# Patient Record
Sex: Male | Born: 1990
Health system: Southern US, Community
[De-identification: ages and names within clinical notes are randomized; demographics above are authoritative.]

## PROBLEM LIST (undated history)

## (undated) DIAGNOSIS — I219 Acute myocardial infarction, unspecified: Secondary | ICD-10-CM

## (undated) DIAGNOSIS — Q210 Ventricular septal defect: Secondary | ICD-10-CM

## (undated) DIAGNOSIS — M549 Dorsalgia, unspecified: Secondary | ICD-10-CM

## (undated) DIAGNOSIS — F419 Anxiety disorder, unspecified: Secondary | ICD-10-CM

## (undated) DIAGNOSIS — K219 Gastro-esophageal reflux disease without esophagitis: Secondary | ICD-10-CM

## (undated) DIAGNOSIS — K279 Peptic ulcer, site unspecified, unspecified as acute or chronic, without hemorrhage or perforation: Secondary | ICD-10-CM

## (undated) DIAGNOSIS — I442 Atrioventricular block, complete: Secondary | ICD-10-CM

## (undated) DIAGNOSIS — Z950 Presence of cardiac pacemaker: Secondary | ICD-10-CM

## (undated) DIAGNOSIS — I272 Pulmonary hypertension, unspecified: Secondary | ICD-10-CM

## (undated) DIAGNOSIS — M25569 Pain in unspecified knee: Secondary | ICD-10-CM

## (undated) HISTORY — PX: EYE SURGERY: SHX253

## (undated) HISTORY — DX: Gastro-esophageal reflux disease without esophagitis: K21.9

## (undated) HISTORY — PX: TONSILLECTOMY: SUR1361

## (undated) HISTORY — DX: Peptic ulcer, site unspecified, unspecified as acute or chronic, without hemorrhage or perforation: K27.9

## (undated) SURGERY — EGD (ESOPHAGOGASTRODUODENOSCOPY)
Anesthesia: Moderate Sedation

---
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
## 2011-01-04 DIAGNOSIS — I219 Acute myocardial infarction, unspecified: Secondary | ICD-10-CM

## 2011-01-04 HISTORY — DX: Acute myocardial infarction, unspecified: I21.9

## 2011-01-06 ENCOUNTER — Ambulatory Visit (INDEPENDENT_AMBULATORY_CARE_PROVIDER_SITE_OTHER): Payer: Medicaid Other | Admitting: Internal Medicine

## 2011-02-07 ENCOUNTER — Inpatient Hospital Stay (HOSPITAL_COMMUNITY)
Admission: EM | Admit: 2011-02-07 | Discharge: 2011-02-24 | DRG: 957 | Disposition: A | Discharge status: Other Acute Inpatient Hospital | Payer: Medicaid Other | Attending: Surgery | Admitting: Surgery

## 2011-02-07 ENCOUNTER — Emergency Department (HOSPITAL_COMMUNITY): Payer: Medicaid Other

## 2011-02-07 ENCOUNTER — Encounter (HOSPITAL_COMMUNITY): Payer: Self-pay | Admitting: Emergency Medicine

## 2011-02-07 ENCOUNTER — Other Ambulatory Visit: Payer: Self-pay

## 2011-02-07 DIAGNOSIS — S3210XA Unspecified fracture of sacrum, initial encounter for closed fracture: Secondary | ICD-10-CM | POA: Diagnosis present

## 2011-02-07 DIAGNOSIS — S32509A Unspecified fracture of unspecified pubis, initial encounter for closed fracture: Secondary | ICD-10-CM

## 2011-02-07 DIAGNOSIS — I498 Other specified cardiac arrhythmias: Secondary | ICD-10-CM | POA: Diagnosis present

## 2011-02-07 DIAGNOSIS — S069X9A Unspecified intracranial injury with loss of consciousness of unspecified duration, initial encounter: Secondary | ICD-10-CM | POA: Diagnosis present

## 2011-02-07 DIAGNOSIS — Y92410 Unspecified street and highway as the place of occurrence of the external cause: Secondary | ICD-10-CM

## 2011-02-07 DIAGNOSIS — F341 Dysthymic disorder: Secondary | ICD-10-CM | POA: Diagnosis present

## 2011-02-07 DIAGNOSIS — T17908A Unspecified foreign body in respiratory tract, part unspecified causing other injury, initial encounter: Secondary | ICD-10-CM | POA: Diagnosis present

## 2011-02-07 DIAGNOSIS — I442 Atrioventricular block, complete: Secondary | ICD-10-CM | POA: Diagnosis present

## 2011-02-07 DIAGNOSIS — S060X9A Concussion with loss of consciousness of unspecified duration, initial encounter: Secondary | ICD-10-CM

## 2011-02-07 DIAGNOSIS — S064X0A Epidural hemorrhage without loss of consciousness, initial encounter: Secondary | ICD-10-CM | POA: Diagnosis present

## 2011-02-07 DIAGNOSIS — S064XAA Epidural hemorrhage with loss of consciousness status unknown, initial encounter: Secondary | ICD-10-CM | POA: Diagnosis present

## 2011-02-07 DIAGNOSIS — K5900 Constipation, unspecified: Secondary | ICD-10-CM | POA: Diagnosis not present

## 2011-02-07 DIAGNOSIS — S129XXA Fracture of neck, unspecified, initial encounter: Secondary | ICD-10-CM | POA: Diagnosis present

## 2011-02-07 DIAGNOSIS — S32599A Other specified fracture of unspecified pubis, initial encounter for closed fracture: Secondary | ICD-10-CM | POA: Diagnosis present

## 2011-02-07 DIAGNOSIS — Z79899 Other long term (current) drug therapy: Secondary | ICD-10-CM

## 2011-02-07 DIAGNOSIS — I2789 Other specified pulmonary heart diseases: Secondary | ICD-10-CM | POA: Diagnosis present

## 2011-02-07 DIAGNOSIS — J45909 Unspecified asthma, uncomplicated: Secondary | ICD-10-CM | POA: Diagnosis present

## 2011-02-07 DIAGNOSIS — S069XAA Unspecified intracranial injury with loss of consciousness status unknown, initial encounter: Secondary | ICD-10-CM | POA: Diagnosis present

## 2011-02-07 DIAGNOSIS — J690 Pneumonitis due to inhalation of food and vomit: Secondary | ICD-10-CM | POA: Diagnosis present

## 2011-02-07 DIAGNOSIS — S0083XA Contusion of other part of head, initial encounter: Secondary | ICD-10-CM

## 2011-02-07 DIAGNOSIS — I272 Pulmonary hypertension, unspecified: Secondary | ICD-10-CM | POA: Diagnosis present

## 2011-02-07 DIAGNOSIS — S1093XA Contusion of unspecified part of neck, initial encounter: Secondary | ICD-10-CM

## 2011-02-07 DIAGNOSIS — S12100A Unspecified displaced fracture of second cervical vertebra, initial encounter for closed fracture: Principal | ICD-10-CM | POA: Diagnosis present

## 2011-02-07 DIAGNOSIS — K219 Gastro-esophageal reflux disease without esophagitis: Secondary | ICD-10-CM | POA: Diagnosis not present

## 2011-02-07 DIAGNOSIS — S0003XA Contusion of scalp, initial encounter: Secondary | ICD-10-CM

## 2011-02-07 DIAGNOSIS — D62 Acute posthemorrhagic anemia: Secondary | ICD-10-CM | POA: Diagnosis not present

## 2011-02-07 DIAGNOSIS — F17200 Nicotine dependence, unspecified, uncomplicated: Secondary | ICD-10-CM | POA: Diagnosis present

## 2011-02-07 DIAGNOSIS — Z8241 Family history of sudden cardiac death: Secondary | ICD-10-CM

## 2011-02-07 DIAGNOSIS — F1210 Cannabis abuse, uncomplicated: Secondary | ICD-10-CM | POA: Diagnosis present

## 2011-02-07 DIAGNOSIS — Q210 Ventricular septal defect: Secondary | ICD-10-CM

## 2011-02-07 DIAGNOSIS — F1010 Alcohol abuse, uncomplicated: Secondary | ICD-10-CM | POA: Diagnosis present

## 2011-02-07 DIAGNOSIS — S27329A Contusion of lung, unspecified, initial encounter: Secondary | ICD-10-CM | POA: Diagnosis present

## 2011-02-07 HISTORY — DX: Anxiety disorder, unspecified: F41.9

## 2011-02-07 HISTORY — DX: Atrioventricular block, complete: I44.2

## 2011-02-07 HISTORY — DX: Pain in unspecified knee: M25.569

## 2011-02-07 HISTORY — DX: Pulmonary hypertension, unspecified: I27.20

## 2011-02-07 HISTORY — DX: Ventricular septal defect: Q21.0

## 2011-02-07 HISTORY — DX: Dorsalgia, unspecified: M54.9

## 2011-02-07 LAB — POCT I-STAT 3, ART BLOOD GAS (G3+)
O2 Saturation: 96 %
TCO2: 19 mmol/L (ref 0–100)
pCO2 arterial: 35.6 mmHg (ref 35.0–45.0)
pH, Arterial: 7.308 — ABNORMAL LOW (ref 7.350–7.450)
pO2, Arterial: 86 mmHg (ref 80.0–100.0)

## 2011-02-07 LAB — MRSA PCR SCREENING: MRSA by PCR: NEGATIVE

## 2011-02-07 LAB — URINE MICROSCOPIC-ADD ON

## 2011-02-07 LAB — DIFFERENTIAL
Basophils Relative: 0 % (ref 0–1)
Eosinophils Relative: 1 % (ref 0–5)
Lymphocytes Relative: 22 % (ref 12–46)
Monocytes Absolute: 2.2 10*3/uL — ABNORMAL HIGH (ref 0.1–1.0)
Monocytes Relative: 4 % (ref 3–12)
Neutrophils Relative %: 73 % (ref 43–77)

## 2011-02-07 LAB — URINALYSIS, ROUTINE W REFLEX MICROSCOPIC
Glucose, UA: 500 mg/dL — AB
Ketones, ur: 15 mg/dL — AB
Leukocytes, UA: NEGATIVE
Nitrite: NEGATIVE
Specific Gravity, Urine: >1.03 — ABNORMAL HIGH (ref 1.005–1.030)
pH: 6.5 (ref 5.0–8.0)

## 2011-02-07 LAB — CBC
HCT: 41.8 % (ref 39.0–52.0)
Hemoglobin: 15.3 g/dL (ref 13.0–17.0)
MCH: 30.4 pg (ref 26.0–34.0)
MCH: 30.4 pg (ref 26.0–34.0)
MCHC: 33.3 g/dL (ref 30.0–36.0)
MCV: 91.5 fL (ref 78.0–100.0)
Platelets: 309 10*3/uL (ref 150–400)
RBC: 5.03 MIL/uL (ref 4.22–5.81)
RDW: 13.9 % (ref 11.5–15.5)
WBC: 41.8 10*3/uL — ABNORMAL HIGH (ref 4.0–10.5)

## 2011-02-07 LAB — POCT I-STAT, CHEM 8
Chloride: 110 mEq/L (ref 96–112)
Creatinine, Ser: 0.8 mg/dL (ref 0.50–1.35)
Glucose, Bld: 204 mg/dL — ABNORMAL HIGH (ref 70–99)
HCT: 48 % (ref 39.0–52.0)
Hemoglobin: 16.3 g/dL (ref 13.0–17.0)
Potassium: 3.6 mEq/L (ref 3.5–5.1)
Sodium: 144 mEq/L (ref 135–145)

## 2011-02-07 LAB — RAPID URINE DRUG SCREEN, HOSP PERFORMED
Barbiturates: NOT DETECTED
Benzodiazepines: NOT DETECTED
Cocaine: NOT DETECTED

## 2011-02-07 LAB — ETHANOL: Alcohol, Ethyl (B): <11 mg/dL (ref 0–11)

## 2011-02-07 MED ORDER — LORAZEPAM 2 MG/ML IJ SOLN
1.0000 mg | Freq: Once | INTRAMUSCULAR | Status: AC
  Administered 2011-02-07: 1 mg via INTRAVENOUS

## 2011-02-07 MED ORDER — MORPHINE SULFATE (PF) 1 MG/ML IV SOLN
INTRAVENOUS | Status: DC
  Administered 2011-02-07: 1.5 mg via INTRAVENOUS
  Administered 2011-02-07 – 2011-02-08 (×2): via INTRAVENOUS
  Administered 2011-02-08: 4.5 mg via INTRAVENOUS
  Administered 2011-02-08: 18 mg via INTRAVENOUS
  Administered 2011-02-08: 4.3 mg via INTRAVENOUS
  Administered 2011-02-08: 7.5 mg via INTRAVENOUS
  Administered 2011-02-08: 4.5 mg via INTRAVENOUS
  Administered 2011-02-09: 21:00:00 via INTRAVENOUS
  Administered 2011-02-09: 6 mg via INTRAVENOUS
  Administered 2011-02-09: 5.86 mg via INTRAVENOUS
  Administered 2011-02-09: 6.29 mg via INTRAVENOUS
  Administered 2011-02-09: 9.59 mg via INTRAVENOUS
  Administered 2011-02-09: 12:00:00 via INTRAVENOUS
  Administered 2011-02-09: 4.5 mg via INTRAVENOUS
  Administered 2011-02-09: 6 mg via INTRAVENOUS
  Administered 2011-02-10: 16.02 mg via INTRAVENOUS
  Administered 2011-02-10: 7.5 mg via INTRAVENOUS
  Administered 2011-02-10: 10.5 mg via INTRAVENOUS
  Administered 2011-02-10: 4.02 mg via INTRAVENOUS
  Administered 2011-02-10: 11.89 mg via INTRAVENOUS
  Administered 2011-02-10: 6 mg via INTRAVENOUS
  Administered 2011-02-10: 11:00:00 via INTRAVENOUS
  Administered 2011-02-11: 12 mg via INTRAVENOUS
  Administered 2011-02-11: 6 mg via INTRAVENOUS
  Administered 2011-02-11: 03:00:00 via INTRAVENOUS
  Administered 2011-02-11: 10.5 mg via INTRAVENOUS
  Filled 2011-02-07 (×8): qty 25

## 2011-02-07 MED ORDER — POTASSIUM CHLORIDE IN NACL 20-0.45 MEQ/L-% IV SOLN
INTRAVENOUS | Status: DC
  Administered 2011-02-07: 21:00:00 via INTRAVENOUS
  Administered 2011-02-08: 125 mL via INTRAVENOUS
  Administered 2011-02-08: 06:00:00 via INTRAVENOUS
  Administered 2011-02-08: 125 mL via INTRAVENOUS
  Administered 2011-02-09: 75 mL via INTRAVENOUS
  Administered 2011-02-09: 125 mL via INTRAVENOUS
  Administered 2011-02-10: 05:00:00 via INTRAVENOUS
  Administered 2011-02-10: 1000 mL via INTRAVENOUS
  Filled 2011-02-07 (×10): qty 1000

## 2011-02-07 MED ORDER — DOCUSATE SODIUM 100 MG PO CAPS
100.0000 mg | ORAL_CAPSULE | Freq: Two times a day (BID) | ORAL | Status: DC
  Administered 2011-02-09 – 2011-02-15 (×12): 100 mg via ORAL
  Filled 2011-02-07 (×11): qty 1

## 2011-02-07 MED ORDER — PANTOPRAZOLE SODIUM 40 MG IV SOLR
40.0000 mg | Freq: Every day | INTRAVENOUS | Status: DC
  Administered 2011-02-07 – 2011-02-08 (×2): 40 mg via INTRAVENOUS
  Filled 2011-02-07 (×5): qty 40

## 2011-02-07 MED ORDER — ALBUTEROL SULFATE (5 MG/ML) 0.5% IN NEBU
2.5000 mg | INHALATION_SOLUTION | RESPIRATORY_TRACT | Status: DC
  Administered 2011-02-07 – 2011-02-10 (×15): 2.5 mg via RESPIRATORY_TRACT
  Filled 2011-02-07 (×16): qty 0.5

## 2011-02-07 MED ORDER — SODIUM CHLORIDE 0.9 % IV SOLN
INTRAVENOUS | Status: DC
  Administered 2011-02-07: 12:00:00 via INTRAVENOUS

## 2011-02-07 MED ORDER — LORAZEPAM 2 MG/ML IJ SOLN: 1.0000 mg | Freq: Once | INTRAMUSCULAR | Status: DC

## 2011-02-07 MED ORDER — ONDANSETRON HCL 4 MG/2ML IJ SOLN
INTRAMUSCULAR | Status: AC
  Administered 2011-02-07: 4 mg via INTRAVENOUS
  Filled 2011-02-07: qty 2

## 2011-02-07 MED ORDER — PIPERACILLIN-TAZOBACTAM 3.375 G IVPB
3.3750 g | Freq: Three times a day (TID) | INTRAVENOUS | Status: AC
  Administered 2011-02-07 – 2011-02-14 (×21): 3.375 g via INTRAVENOUS
  Filled 2011-02-07 (×24): qty 50

## 2011-02-07 MED ORDER — DIPHENHYDRAMINE HCL 50 MG/ML IJ SOLN: 12.5000 mg | Freq: Four times a day (QID) | INTRAMUSCULAR | Status: DC | PRN

## 2011-02-07 MED ORDER — ONDANSETRON HCL 4 MG/2ML IJ SOLN
4.0000 mg | Freq: Once | INTRAMUSCULAR | Status: AC
  Administered 2011-02-07: 4 mg via INTRAVENOUS

## 2011-02-07 MED ORDER — MORPHINE SULFATE 2 MG/ML IJ SOLN: 2.0000 mg | INTRAMUSCULAR | Status: DC | PRN

## 2011-02-07 MED ORDER — DIPHENHYDRAMINE HCL 12.5 MG/5ML PO ELIX
12.5000 mg | ORAL_SOLUTION | Freq: Four times a day (QID) | ORAL | Status: DC | PRN
  Filled 2011-02-07: qty 5

## 2011-02-07 MED ORDER — FENTANYL CITRATE 0.05 MG/ML IJ SOLN
50.0000 ug | Freq: Once | INTRAMUSCULAR | Status: AC
  Administered 2011-02-07: 50 ug via INTRAVENOUS
  Filled 2011-02-07: qty 2

## 2011-02-07 MED ORDER — MORPHINE SULFATE 2 MG/ML IJ SOLN
2.0000 mg | INTRAMUSCULAR | Status: DC | PRN
  Administered 2011-02-07 (×2): 2 mg via INTRAVENOUS
  Filled 2011-02-07 (×2): qty 1

## 2011-02-07 MED ORDER — ONDANSETRON HCL 4 MG PO TABS
4.0000 mg | ORAL_TABLET | Freq: Four times a day (QID) | ORAL | Status: DC | PRN
  Administered 2011-02-09 – 2011-02-13 (×5): 4 mg via ORAL
  Filled 2011-02-07 (×5): qty 1

## 2011-02-07 MED ORDER — ENOXAPARIN SODIUM 30 MG/0.3ML ~~LOC~~ SOLN
30.0000 mg | Freq: Two times a day (BID) | SUBCUTANEOUS | Status: DC
  Administered 2011-02-08 – 2011-02-13 (×12): 30 mg via SUBCUTANEOUS
  Filled 2011-02-07 (×14): qty 0.3

## 2011-02-07 MED ORDER — BIOTENE DRY MOUTH MT LIQD
15.0000 mL | Freq: Four times a day (QID) | OROMUCOSAL | Status: DC
  Administered 2011-02-08 – 2011-02-20 (×36): 15 mL via OROMUCOSAL

## 2011-02-07 MED ORDER — PANTOPRAZOLE SODIUM 40 MG PO TBEC
40.0000 mg | DELAYED_RELEASE_TABLET | Freq: Every day | ORAL | Status: DC
  Administered 2011-02-09 – 2011-02-10 (×2): 40 mg via ORAL
  Filled 2011-02-07 (×2): qty 1

## 2011-02-07 MED ORDER — CHLORHEXIDINE GLUCONATE 0.12 % MT SOLN
15.0000 mL | Freq: Two times a day (BID) | OROMUCOSAL | Status: DC
  Administered 2011-02-07 – 2011-02-24 (×30): 15 mL via OROMUCOSAL
  Filled 2011-02-07 (×38): qty 15

## 2011-02-07 MED ORDER — ONDANSETRON HCL 4 MG/2ML IJ SOLN
4.0000 mg | Freq: Four times a day (QID) | INTRAMUSCULAR | Status: DC | PRN
  Administered 2011-02-10 – 2011-02-16 (×5): 4 mg via INTRAVENOUS
  Filled 2011-02-07 (×7): qty 2

## 2011-02-07 MED ORDER — LORAZEPAM 2 MG/ML IJ SOLN
INTRAMUSCULAR | Status: AC
  Administered 2011-02-07: 1 mg via INTRAVENOUS
  Filled 2011-02-07: qty 1

## 2011-02-07 MED ORDER — LORAZEPAM 2 MG/ML IJ SOLN
INTRAMUSCULAR | Status: AC
  Administered 2011-02-07: 1 mg
  Filled 2011-02-07: qty 1

## 2011-02-07 MED ORDER — NALOXONE HCL 0.4 MG/ML IJ SOLN: 0.4000 mg | INTRAMUSCULAR | Status: DC | PRN

## 2011-02-07 MED ORDER — SODIUM CHLORIDE 0.9 % IJ SOLN: 9.0000 mL | INTRAMUSCULAR | Status: DC | PRN

## 2011-02-07 NOTE — ED Notes (Signed)
Unable to assess pt living and suicide as well as hx and allergies due to aloc
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
## 2011-02-07 NOTE — ED Notes (Signed)
Completed by Dr Horace Porteous RN and [facility] RN Patient tolerated procedure without incident moves all extremities bilateral equal and strong full sensation.

## 2011-02-07 NOTE — ED Notes (Signed)
Dr Ignacia Palma asked if pt needs to be on [facility] for transport and Dr Ignacia Palma reported that the pt did not need to go on [facility] for transport.

## 2011-02-07 NOTE — ED Notes (Signed)
Patient states pain improved scale 7/10 achy with Father and Grandmother at bedside.
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
## 2011-02-07 NOTE — ED Notes (Signed)
Patient states pain 7/10 achy sharp lower back bilateral lower extremities and chest.  Airway intact bilateral equal chest rise and fall. Ax3 father at bedside.

## 2011-02-07 NOTE — ED Notes (Signed)
RN to ct transfer to ct table holding c-spine. Patient tolerated transfer without incident.  Move all extremities bilateral equal and strong. Full sensation.

## 2011-02-07 NOTE — ED Notes (Signed)
Respiratory at bedside for ABG
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
## 2011-02-07 NOTE — ED Notes (Signed)
Pt placed on 15L NRB due to low sats.
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
## 2011-02-07 NOTE — ED Notes (Signed)
Father and grandmother at bedside
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
## 2011-02-07 NOTE — Progress Notes (Signed)
Trauma On Call Note  Came to see the patient in the ICU where he has been for about one hour.  His oxygen saturation is 97%100% on 6L,   He is still very confused.  Somewhat combative.  No abdominal pain.  No peritonitis.  Urine output is okay.  No need for abdominal exploration currently.  Marta Lamas. Gae Bon, MD, FACS [phone] Trauma Surgeon
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
## 2011-02-07 NOTE — ED Notes (Signed)
Patient returned from CT with RN transferred from CT to ED bed without incident. Moves all extremities bilateral equal and full sensation.

## 2011-02-08 ENCOUNTER — Encounter (HOSPITAL_COMMUNITY): Payer: Self-pay | Admitting: Internal Medicine

## 2011-02-08 ENCOUNTER — Inpatient Hospital Stay (HOSPITAL_COMMUNITY): Payer: Medicaid Other

## 2011-02-08 DIAGNOSIS — Z720 Tobacco use: Secondary | ICD-10-CM | POA: Insufficient documentation

## 2011-02-08 DIAGNOSIS — D62 Acute posthemorrhagic anemia: Secondary | ICD-10-CM | POA: Diagnosis not present

## 2011-02-08 DIAGNOSIS — F1010 Alcohol abuse, uncomplicated: Secondary | ICD-10-CM | POA: Insufficient documentation

## 2011-02-08 DIAGNOSIS — I442 Atrioventricular block, complete: Secondary | ICD-10-CM

## 2011-02-08 DIAGNOSIS — F1210 Cannabis abuse, uncomplicated: Secondary | ICD-10-CM | POA: Insufficient documentation

## 2011-02-08 LAB — CBC
HCT: 37.2 % — ABNORMAL LOW (ref 39.0–52.0)
Hemoglobin: 12.6 g/dL — ABNORMAL LOW (ref 13.0–17.0)
MCHC: 33.9 g/dL (ref 30.0–36.0)
RBC: 4.15 MIL/uL — ABNORMAL LOW (ref 4.22–5.81)

## 2011-02-08 LAB — GLUCOSE, CAPILLARY: Glucose-Capillary: 147 mg/dL — ABNORMAL HIGH (ref 70–99)

## 2011-02-08 NOTE — Progress Notes (Signed)
Dr. Lindie Spruce called with results of ECG (pt is in 3rd degree heart block), additional assessment information given, Dr. Lindie Spruce saw ECG, no orders received
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
## 2011-02-08 NOTE — Progress Notes (Signed)
  Echocardiogram 2D Echocardiogram has been performed.  Peyton Spengler, Real Cons 02/08/2011, 4:30 PM
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
## 2011-02-08 NOTE — Progress Notes (Signed)
UR of chart completed.  

## 2011-02-09 ENCOUNTER — Inpatient Hospital Stay (HOSPITAL_COMMUNITY): Payer: Medicaid Other

## 2011-02-09 ENCOUNTER — Encounter (HOSPITAL_COMMUNITY): Payer: Self-pay | Admitting: Internal Medicine

## 2011-02-09 DIAGNOSIS — Q210 Ventricular septal defect: Secondary | ICD-10-CM

## 2011-02-09 DIAGNOSIS — I272 Pulmonary hypertension, unspecified: Secondary | ICD-10-CM | POA: Diagnosis present

## 2011-02-09 DIAGNOSIS — I2789 Other specified pulmonary heart diseases: Secondary | ICD-10-CM

## 2011-02-09 LAB — COMPREHENSIVE METABOLIC PANEL WITH GFR
ALT: 166 U/L — ABNORMAL HIGH (ref 0–53)
AST: 95 U/L — ABNORMAL HIGH (ref 0–37)
Albumin: 3 g/dL — ABNORMAL LOW (ref 3.5–5.2)
Alkaline Phosphatase: 61 U/L (ref 39–117)
BUN: 8 mg/dL (ref 6–23)
CO2: 23 meq/L (ref 19–32)
Calcium: 8.3 mg/dL — ABNORMAL LOW (ref 8.4–10.5)
Chloride: 104 meq/L (ref 96–112)
Creatinine, Ser: 0.55 mg/dL (ref 0.50–1.35)
GFR calc Af Amer: >90 mL/min
GFR calc non Af Amer: >90 mL/min
Glucose, Bld: 129 mg/dL — ABNORMAL HIGH (ref 70–99)
Potassium: 4.4 meq/L (ref 3.5–5.1)
Sodium: 135 meq/L (ref 135–145)
Total Bilirubin: 0.8 mg/dL (ref 0.3–1.2)
Total Protein: 5.6 g/dL — ABNORMAL LOW (ref 6.0–8.3)

## 2011-02-09 LAB — CBC
HCT: 34.3 % — ABNORMAL LOW (ref 39.0–52.0)
Hemoglobin: 11.3 g/dL — ABNORMAL LOW (ref 13.0–17.0)
MCH: 30 pg (ref 26.0–34.0)
MCHC: 32.9 g/dL (ref 30.0–36.0)
MCV: 91 fL (ref 78.0–100.0)
Platelets: 125 K/uL — ABNORMAL LOW (ref 150–400)
RBC: 3.77 MIL/uL — ABNORMAL LOW (ref 4.22–5.81)
RDW: 14 % (ref 11.5–15.5)
WBC: 22.3 K/uL — ABNORMAL HIGH (ref 4.0–10.5)

## 2011-02-09 LAB — T4, FREE: Free T4: 0.98 ng/dL (ref 0.80–1.80)

## 2011-02-09 MED ORDER — ACETAMINOPHEN 325 MG PO TABS
650.0000 mg | ORAL_TABLET | Freq: Four times a day (QID) | ORAL | Status: DC | PRN
  Administered 2011-02-10: 650 mg via ORAL
  Filled 2011-02-09: qty 2

## 2011-02-09 MED ORDER — SALINE SPRAY 0.65 % NA SOLN
1.0000 | NASAL | Status: DC | PRN
  Administered 2011-02-09: 1 via NASAL
  Filled 2011-02-09 (×2): qty 44
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
## 2011-02-09 NOTE — Progress Notes (Signed)
Follow up - Trauma and Critical Care  Patient Details:    Neil Sanders is an 21 y.o. male.  Lines/tubes : Urethral Catheter Non-latex 14 Fr. (Active)  Site Assessment Clean;Intact 02/09/2011  8:00 AM  Collection Container Standard drainage bag 02/09/2011  8:00 AM  Securement Method Leg strap 02/09/2011  8:00 AM  Urinary Catheter Interventions Unclamped 02/09/2011  8:00 AM  Indication for Insertion or Continuance of Catheter Physician order;Urinary output monitoring;Prolonged immobilization 02/09/2011  8:00 AM  Output (mL) 200 mL 02/09/2011  8:00 AM    Microbiology/Sepsis markers: Results for orders placed during the hospital encounter of 02/07/11  MRSA PCR SCREENING     Status: Normal   Collection Time   02/07/11  7:04 PM      Component Value Range Status Comment   MRSA by PCR NEGATIVE  NEGATIVE  Final     Anti-infectives:  Anti-infectives     Start     Dose/Rate Route Frequency Ordered Stop   02/07/11 2200  piperacillin-tazobactam (ZOSYN) IVPB 3.375 g       3.375 g 12.5 mL/hr over 240 Minutes Intravenous 3 times per day 02/07/11 1953            Best Practice/Protocols:  VTE Prophylaxis: Lovenox (prophylaxtic dose) and Mechanical GI Prophylaxis: Proton Pump Inhibitor Intermittent Sedation  Consults: Treatment Team:  Eulas Post, MD Cristi Loron, MD Valera Castle, MD    Events:  Subjective:    Overnight Issues: Complaining of pain in his legs and his back.  Has issues with chroinc back pain.  Objective:  Vital signs for last 24 hours: Temp:  [97.8 F (36.6 C)-99.2 F (37.3 C)] 97.8 F (36.6 C) (02/06 0754) Pulse Rate:  [58-65] 61  (02/06 0800) Resp:  [14-32] 30  (02/06 0800) BP: (111-148)/(56-108) 130/62 mmHg (02/06 0800) SpO2:  [90 %-100 %] 99 % (02/06 0813) Weight:  [108.9 kg (240 lb 1.3 oz)] 108.9 kg (240 lb 1.3 oz) (02/06 0500)  Hemodynamic parameters for last 24 hours:    Intake/Output from previous day: 02/05 0701 - 02/06 0700 In: 3041.3  [I.V.:2891.3; IV Piggyback:150] Out: 1350 [Urine:1350]  Intake/Output this shift: Total I/O In: 271.9 [I.V.:271.9] Out: 200 [Urine:200]  Vent settings for last 24 hours:    Physical Exam:  General: alert, no respiratory distress and more oriented than prebviously. HEENT/Neck: no JVD and Cervical collar not in appropriate position.  Will get adjusted. CVS: brady, murmur and Has a holosystolic murmur that was not noted previously.  Still in 3rd degree heart block.  Has a large VSD noted on echocardiogram GI: soft, nontender, BS WNL, no r/g and the patient is hungry and thristy Extremities: no edema, no erythema, pulses WNL and complaining of pain, but not obvious defects.  Results for orders placed during the hospital encounter of 02/07/11 (from the past 24 hour(s))  CBC     Status: Abnormal   Collection Time   02/09/11  4:16 AM      Component Value Range   WBC 22.3 (*) 4.0 - 10.5 (K/uL)   RBC 3.77 (*) 4.22 - 5.81 (MIL/uL)   Hemoglobin 11.3 (*) 13.0 - 17.0 (g/dL)   HCT 47.8 (*) 29.5 - 52.0 (%)   MCV 91.0  78.0 - 100.0 (fL)   MCH 30.0  26.0 - 34.0 (pg)   MCHC 32.9  30.0 - 36.0 (g/dL)   RDW 62.1  30.8 - 65.7 (%)   Platelets 125 (*) 150 - 400 (K/uL)  COMPREHENSIVE METABOLIC PANEL  Status: Abnormal   Collection Time   02/09/11  4:16 AM      Component Value Range   Sodium 135  135 - 145 (mEq/L)   Potassium 4.4  3.5 - 5.1 (mEq/L)   Chloride 104  96 - 112 (mEq/L)   CO2 23  19 - 32 (mEq/L)   Glucose, Bld 129 (*) 70 - 99 (mg/dL)   BUN 8  6 - 23 (mg/dL)   Creatinine, Ser 1.19  0.50 - 1.35 (mg/dL)   Calcium 8.3 (*) 8.4 - 10.5 (mg/dL)   Total Protein 5.6 (*) 6.0 - 8.3 (g/dL)   Albumin 3.0 (*) 3.5 - 5.2 (g/dL)   AST 95 (*) 0 - 37 (U/L)   ALT 166 (*) 0 - 53 (U/L)   Alkaline Phosphatase 61  39 - 117 (U/L)   Total Bilirubin 0.8  0.3 - 1.2 (mg/dL)   GFR calc non Af Amer >90  >90 (mL/min)   GFR calc Af Amer >90  >90 (mL/min)     Assessment/Plan:   NEURO  Altered Mental Status:   agitation and intermittent agitation and confusion. Trauma-CNS:  concussion   Plan: No changes  PULM  Lung Trauma (left and with contusion of lung)   Plan: Oxygen therapy  CARDIO  AV Block (complete heart block) Congenital Heart Disease (not surgically repaired and VSD by echo)   Plan: Per cardiology, plan to get MRI of heart to better assess  RENAL  No issues   Plan: No changes  GI  Some vomiting early yesterday, today the patient has a good appetite   Plan: Clear liquids  ID  Pneumonia (aspiration) Presumed aspiration, but no cultures are pending.  On Zosyn empirically   Plan: Continue for full course of antibiotics  HEME  Anemia acute blood loss anemia) Leukocytosis (neutrophilia and Improving though)   Plan: No changes  ENDO No new issues   Plan: No changes   Global Issues  This patient has an unstable C-2 fracture and pulmonary contusion.  Elevated WBC possibly from aspiration PNA.  No concerns for intra-abdominal process.  Needs Cardiac MRI.  Will transfer to SDU.  Decrease IVF.  Clear liquids.    LOS: 2 days   Additional comments:I reviewed the patient's new clinical lab test results. CBC, Bmet and I reviewed the patients new imaging test results. CXR  Critical Care Total Time*: 30 Minutes  Rozlyn Yerby III,Anil Havard O 02/09/2011  *Care during the described time interval was provided by me and/or other providers on the critical care team.  I have reviewed this patient's available data, including medical history, events of note, physical examination and test results as part of my evaluation.
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
## 2011-02-10 MED ORDER — ALBUTEROL SULFATE (5 MG/ML) 0.5% IN NEBU
2.5000 mg | INHALATION_SOLUTION | RESPIRATORY_TRACT | Status: DC | PRN
  Administered 2011-02-14 – 2011-02-19 (×2): 2.5 mg via RESPIRATORY_TRACT
  Filled 2011-02-10 (×3): qty 0.5

## 2011-02-10 NOTE — Progress Notes (Signed)
Patient ID: Neil Sanders, male   DOB: 26-Sep-1990, 21 y.o.   MRN: [id number] Subjective:  The patient is alert and pleasant. He complains of neck pain. I spoke with his grandmother.  Objective: Vital signs in last 24 hours: Temp:  [97.7 F (36.5 C)-98.9 F (37.2 C)] 97.7 F (36.5 C) (02/07 0759) Pulse Rate:  [58-74] 58  (02/07 0342) Resp:  [16-35] 20  (02/07 0342) BP: (120-135)/(65-72) 125/65 mmHg (02/07 0342) SpO2:  [95 %-99 %] 95 % (02/07 0905)  Intake/Output from previous day: 02/06 0701 - 02/07 0700 In: 2023.9 [P.O.:50; I.V.:1823.9; IV Piggyback:150] Out: 1545 [Urine:1545] Intake/Output this shift:    Physical exam the patient is alert and oriented. He is moving all 4 extremities well.  Lab Results:  Basename 02/09/11 0416 02/08/11 0429  WBC 22.3* 26.8*  HGB 11.3* 12.6*  HCT 34.3* 37.2*  PLT 125* 161   BMET  Basename 02/09/11 0416  NA 135  K 4.4  CL 104  CO2 23  GLUCOSE 129*  BUN 8  CREATININE 0.55  CALCIUM 8.3*    Studies/Results: Dg Chest Port 1 View  02/09/2011  *RADIOLOGY REPORT*  Clinical Data: Pulmonary contusion, follow-up  PORTABLE CHEST - 1 VIEW  Comparison: Portable chest x-ray of 02/08/2011  Findings: There is little change in poor aeration and patchy airspace disease.  Cardiomegaly is stable.  Thoracic kyphosis is unchanged.  IMPRESSION: No change in poor aeration and vague airspace disease bilaterally.  Original Report Authenticated By: Juline Patch, M.D.    Assessment/Plan: Type II odontoid fracture: I will tentatively plan his surgery for next week.  LOS: 3 days     Lowana Hable D 02/10/2011, 3:01 PM
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
## 2011-02-11 ENCOUNTER — Other Ambulatory Visit: Payer: Self-pay | Admitting: Neurosurgery

## 2011-02-11 ENCOUNTER — Inpatient Hospital Stay (HOSPITAL_COMMUNITY): Payer: Medicaid Other

## 2011-02-11 DIAGNOSIS — K219 Gastro-esophageal reflux disease without esophagitis: Secondary | ICD-10-CM | POA: Insufficient documentation

## 2011-02-11 DIAGNOSIS — Z7401 Bed confinement status: Secondary | ICD-10-CM

## 2011-02-11 LAB — CBC
HCT: 30.4 % — ABNORMAL LOW (ref 39.0–52.0)
MCH: 30.9 pg (ref 26.0–34.0)
MCHC: 33.6 g/dL (ref 30.0–36.0)
MCV: 92.1 fL (ref 78.0–100.0)
Platelets: 160 10*3/uL (ref 150–400)
RDW: 14 % (ref 11.5–15.5)

## 2011-02-11 MED ORDER — MORPHINE SULFATE 2 MG/ML IJ SOLN
2.0000 mg | INTRAMUSCULAR | Status: DC | PRN
  Administered 2011-02-11 – 2011-02-12 (×3): 2 mg via INTRAVENOUS
  Administered 2011-02-12: 1.5 mg via INTRAVENOUS
  Administered 2011-02-12 – 2011-02-16 (×4): 2 mg via INTRAVENOUS
  Filled 2011-02-11 (×8): qty 1

## 2011-02-11 MED ORDER — OXYCODONE HCL 5 MG PO TABS
5.0000 mg | ORAL_TABLET | ORAL | Status: DC | PRN
  Administered 2011-02-11: 10 mg via ORAL
  Administered 2011-02-11: 20 mg via ORAL
  Administered 2011-02-11: 5 mg via ORAL
  Administered 2011-02-12: 20 mg via ORAL
  Administered 2011-02-12: 15 mg via ORAL
  Administered 2011-02-12: 20 mg via ORAL
  Administered 2011-02-12: 15 mg via ORAL
  Administered 2011-02-12: 20 mg via ORAL
  Administered 2011-02-16: 5 mg via ORAL
  Filled 2011-02-11: qty 4
  Filled 2011-02-11: qty 3
  Filled 2011-02-11: qty 4
  Filled 2011-02-11 (×2): qty 2
  Filled 2011-02-11: qty 1
  Filled 2011-02-11: qty 3
  Filled 2011-02-11: qty 2
  Filled 2011-02-11 (×2): qty 4

## 2011-02-11 MED ORDER — DIPHENHYDRAMINE HCL 25 MG PO CAPS
25.0000 mg | ORAL_CAPSULE | Freq: Four times a day (QID) | ORAL | Status: DC | PRN
Start: 2011-02-11 — End: 2011-02-24
  Administered 2011-02-11: 25 mg via ORAL
  Filled 2011-02-11 (×2): qty 1

## 2011-02-11 MED ORDER — PANTOPRAZOLE SODIUM 40 MG IV SOLR: 40.0000 mg | Freq: Two times a day (BID) | INTRAVENOUS | Status: DC

## 2011-02-11 MED ORDER — CEFAZOLIN SODIUM 1-5 GM-% IV SOLN: 1.0000 g | INTRAVENOUS | Status: DC

## 2011-02-11 MED ORDER — PANTOPRAZOLE SODIUM 40 MG PO TBEC
40.0000 mg | DELAYED_RELEASE_TABLET | Freq: Two times a day (BID) | ORAL | Status: DC
  Administered 2011-02-11 – 2011-02-24 (×25): 40 mg via ORAL
  Filled 2011-02-11 (×24): qty 1

## 2011-02-11 NOTE — Progress Notes (Signed)
Dr. Lovell Sheehan has clear [place] to allow the patient to ambulate with orthosis.  Will start PT.  WBAT on pelvic fractures.  This patient has been seen and I agree with the findings and treatment plan.  Neil Sanders. Gae Bon, MD, FACS [phone] (pager) [phone] (direct pager) Trauma Surgeon

## 2011-02-11 NOTE — Progress Notes (Signed)
02/11/2011 Milana Kidney DPT PAGER: [phone] OFFICE: [phone]

## 2011-02-11 NOTE — Progress Notes (Signed)
UR of chart complete.  

## 2011-02-11 NOTE — Progress Notes (Signed)
PT/OT/Cancellation Note   Orders received for OT/PT consults.  Went in and discussed with pt that MDs are ready for pt to get OOB.  Pt reports that MD (could not specify which MD) says therapy will start tomorrow.  Reviewed notes and orders which state for therapy to begin 02/11/11.  Pt politely declines and wants to wait until tomorrow to start therapies.  Will check back tomorrow as time permits.   Perrin Maltese, OTR/L Pager number [phone] 02/11/2011

## 2011-02-11 NOTE — Progress Notes (Signed)
Bilateral lower extremity venous duplex completed.  Preliminary report is no obvious evidence of DVT, SVT, or a Baker's cyst.  The study is technically difficult due to the patient's guarding secondary to his pain.

## 2011-02-11 NOTE — Progress Notes (Signed)
Patient ID: Neil Sanders, male   DOB: 1990-06-28, 21 y.o.   MRN: [id number] Subjective:  The patient is alert and pleasant. I spoke to the patient's father.  Objective: Vital signs in last 24 hours: Temp:  [98 F (36.7 C)-99 F (37.2 C)] 98.2 F (36.8 C) (02/08 0435) Pulse Rate:  [51-56] 51  (02/08 0435) Resp:  [17-28] 28  (02/08 0435) BP: (130-138)/(68-78) 131/78 mmHg (02/08 0435) SpO2:  [95 %-98 %] 98 % (02/08 0435)  Intake/Output from previous day: 02/07 0701 - 02/08 0700 In: 460 [P.O.:240; I.V.:120; IV Piggyback:100] Out: 450 [Urine:450] Intake/Output this shift:    Physical exam patient is alert and oriented. He is moving all 4 extremities.  Lab Results:  [facility] 02/09/11 0416  WBC 22.3*  HGB 11.3*  HCT 34.3*  PLT 125*   BMET  Basename 02/09/11 0416  NA 135  K 4.4  CL 104  CO2 23  GLUCOSE 129*  BUN 8  CREATININE 0.55  CALCIUM 8.3*    Studies/Results: No results found.  Assessment/Plan: Type II odontoid fracture: I discussed the situation with the patient and his father. I Think he would do better with surgery. I described the surgical option of an anterior odontoid screw. Described the surgery to them. We have discussed the risks of surgery, including risks of anesthesia, medical complications, bleeding, i instrumentation malplacement or malfunction, etc. I have answered all her questions. I'll tentatively plan surgery for next week. From my point of view the patient can be mobilized in his orthosis. But this will have to be cleared with orthopedic surgery regarding his pelvic fracture.  LOS: 4 days     [name] 02/11/2011, 8:10 AM

## 2011-02-11 NOTE — Progress Notes (Signed)
Spoke to Dr. Lindie Spruce regarding pts. Neil Sanders. Dr. York Spaniel his renal function looked good and to continue to encourage fluids. Pt. Is drinking well. No new orders at this time.

## 2011-02-11 NOTE — Progress Notes (Signed)
Clinical Social Worker attempted to meet with patient at bedside however patient was being transferred to Radiology.  CSW spoke with patient while going down the hallway to inform him that support was available as needed.  Patient acknowledged and was appreciative.  CSW will continue to be available as needed to offer support and facilitate discharge planning needs.  SBIRT to be completed once patient more alert and able to engage in conversation.  Clinical Social Worker remains available for support as needed.  [address] [place], [place] [phone]
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
## 2011-02-12 MED ORDER — HYDROMORPHONE 0.3 MG/ML IV SOLN
INTRAVENOUS | Status: AC
  Administered 2011-02-12: 7.5 mg via INTRAVENOUS
  Filled 2011-02-12: qty 25

## 2011-02-12 MED ORDER — HYDROMORPHONE 0.3 MG/ML IV SOLN
INTRAVENOUS | Status: DC
  Administered 2011-02-12: 7.5 mg via INTRAVENOUS
  Administered 2011-02-13 (×2): 1.2 mg via INTRAVENOUS
  Administered 2011-02-13: 2.35 mg via INTRAVENOUS
  Administered 2011-02-14: 3 mg via INTRAVENOUS
  Administered 2011-02-14: 1.8 mg via INTRAVENOUS
  Administered 2011-02-14: 2 mg via INTRAVENOUS
  Administered 2011-02-14: 22:00:00 via INTRAVENOUS
  Administered 2011-02-14: 1.2 mg via INTRAVENOUS
  Administered 2011-02-14: 2.4 mg via INTRAVENOUS
  Administered 2011-02-14: 2.06 mg via INTRAVENOUS
  Administered 2011-02-15: 22:00:00 via INTRAVENOUS
  Administered 2011-02-15 (×2): 2.1 mg via INTRAVENOUS
  Administered 2011-02-15: 4.2 mg via INTRAVENOUS
  Administered 2011-02-15 – 2011-02-16 (×2): via INTRAVENOUS
  Administered 2011-02-16: 2.7 mg via INTRAVENOUS
  Administered 2011-02-16: 2.08 mg via INTRAVENOUS
  Administered 2011-02-16: 0.5 mg via INTRAVENOUS
  Administered 2011-02-16 (×3): 1.8 mg via INTRAVENOUS
  Administered 2011-02-17: 0.3 mg via INTRAVENOUS
  Administered 2011-02-17 (×2): 2.4 mg via INTRAVENOUS
  Administered 2011-02-17: 3.6 mg via INTRAVENOUS
  Filled 2011-02-12: qty 25

## 2011-02-12 MED ORDER — SODIUM CHLORIDE 0.9 % IJ SOLN: 9.0000 mL | INTRAMUSCULAR | Status: DC | PRN

## 2011-02-12 MED ORDER — NALOXONE HCL 0.4 MG/ML IJ SOLN: 0.4000 mg | INTRAMUSCULAR | Status: DC | PRN

## 2011-02-12 MED ORDER — ONDANSETRON HCL 4 MG/2ML IJ SOLN
4.0000 mg | Freq: Four times a day (QID) | INTRAMUSCULAR | Status: DC | PRN
  Administered 2011-02-17 – 2011-02-24 (×6): 4 mg via INTRAVENOUS
  Filled 2011-02-12 (×7): qty 2

## 2011-02-12 MED ORDER — DIPHENHYDRAMINE HCL 50 MG/ML IJ SOLN: 12.5000 mg | Freq: Four times a day (QID) | INTRAMUSCULAR | Status: DC | PRN

## 2011-02-12 MED ORDER — DIPHENHYDRAMINE HCL 12.5 MG/5ML PO ELIX
12.5000 mg | ORAL_SOLUTION | Freq: Four times a day (QID) | ORAL | Status: DC | PRN
  Filled 2011-02-12: qty 5

## 2011-02-12 MED ORDER — METHOCARBAMOL 500 MG PO TABS
500.0000 mg | ORAL_TABLET | Freq: Four times a day (QID) | ORAL | Status: DC | PRN
  Administered 2011-02-12 – 2011-02-23 (×8): 500 mg via ORAL
  Filled 2011-02-12 (×12): qty 1
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
## 2011-02-12 NOTE — Progress Notes (Signed)
OT Cancellation Note   Treatment cancelled today due to pt with nausea and dizziness.  OT arrived near end of PT session as PT was laying pt back supine in bed due to pt's report off dizziness.  Will re-attempt this afternoon as appropriate. Thanks! 02/12/2011 Cipriano Mile OTR/L Pager [phone] Office [phone]
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
## 2011-02-13 ENCOUNTER — Inpatient Hospital Stay (HOSPITAL_COMMUNITY): Payer: Medicaid Other

## 2011-02-13 LAB — CBC
MCV: 93.2 fL (ref 78.0–100.0)
Platelets: 195 10*3/uL (ref 150–400)
RDW: 14.9 % (ref 11.5–15.5)
WBC: 16 10*3/uL — ABNORMAL HIGH (ref 4.0–10.5)

## 2011-02-13 MED ORDER — HYDROMORPHONE 0.3 MG/ML IV SOLN
INTRAVENOUS | Status: AC
  Filled 2011-02-13: qty 25

## 2011-02-13 NOTE — Progress Notes (Signed)
Pt has ben c/o excruciating pain in his neck.MD on call paged and order obtained for full dose dilaudid PCA. Two hours after starting PCA pt sts pain is a whole lot more  bearable and he is able to relax now. VSS , will continue to monitor.

## 2011-02-13 NOTE — Progress Notes (Signed)
Patient with some increased neck pain upon sitting up at the bedside today. Pain itself is nonradicular. Without sensory numbness paresthesias or weakness. Currently the patient is resting comfortably in the bed. His collar is intact. His motor and sensory function extremities is normal.  We'll check a followup lateral C-spine x-ray today. Final decision regarding operative fixation tomorrow with Dr. Lovell Sheehan.

## 2011-02-13 NOTE — Progress Notes (Signed)
Pt. Was tearful about bedrest and is very ready for surgery. He is having a lot of gas build up in stomach. Refused any medication suggestions for nausea, gas. Poor appetite. Mostly clear liquids, with occasional solids. Had c-spine done today. Pain is a little better than last 2 days because he is on PCA Dilaudid. PT got pt. To sitting position on side of bed but pt. experienced pressure in back of neck. Called Dr. Averill Likes (on call for [facility]) and thought it would be best to not try and progress pt. o chair at this time until he can have surgery. Pt. Would like to get out of bed to chair however. Gunnar Fusi, RN
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
## 2011-02-13 NOTE — Progress Notes (Signed)
Pt has an IV dated 02/04. This RN assessed pt and unable to restart IV. Paged IV team, however the IV RN sts they don't restart IV at night if pt already has patent IV regardless of date. Sts she will add this pt to day shift assignment.
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
## 2011-02-13 NOTE — Plan of Care (Signed)
Problem: Phase I Progression Outcomes Goal: OOB as tolerated unless otherwise ordered Outcome: Not Progressing Pt with increased cervical neck pain/pressure in sitting. MD requesting pt to not transfer at this time.   Neil Sanders (Neil Sanders) Carleene Mains PT, DPT Acute Rehabilitation [phone]
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
## 2011-02-14 ENCOUNTER — Inpatient Hospital Stay (HOSPITAL_COMMUNITY): Payer: Medicaid Other

## 2011-02-14 MED ORDER — MAGNESIUM HYDROXIDE NICU ORAL SYRINGE 400 MG/5 ML
30.0000 mL | Freq: Every day | ORAL | Status: DC | PRN
Start: 2011-02-14 — End: 2011-02-14

## 2011-02-14 MED ORDER — HYDROMORPHONE 0.3 MG/ML IV SOLN
INTRAVENOUS | Status: AC
  Filled 2011-02-14: qty 25

## 2011-02-14 MED ORDER — POLYETHYLENE GLYCOL 3350 17 G PO PACK
17.0000 g | PACK | Freq: Every day | ORAL | Status: DC
  Administered 2011-02-14 – 2011-02-24 (×10): 17 g via ORAL
  Filled 2011-02-14 (×11): qty 1

## 2011-02-14 MED ORDER — MAGNESIUM HYDROXIDE 400 MG/5ML PO SUSP: 30.0000 mL | Freq: Every day | ORAL | Status: DC | PRN

## 2011-02-14 NOTE — Progress Notes (Signed)
Clinical Social Worker met with patient, patient father, and patient grandmother at bedside to offer continued support.  Patient states that he is doing well emotionally but extremely uncomfortable in his brace and questioning the plan for surgery.  CSW provided patient father with contact information in the event that they had questions at a later date.    Clinical Social Worker to continue to follow for emotional support and discharge planning needs.  CSW to complete SBIRT at a later date.  [address] [place], [place] [phone]
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
## 2011-02-14 NOTE — Progress Notes (Signed)
OT / PT Cancellation Note  Treatment cancelled today due to:   Therapy holding treatment awaiting activity order from MD. MD called this AM and currently in surgery.Please order OT/PT with increased activity if appropriate. Pt order set states OOB but therapist was stopped 02/13/11 @ EOB due to pt symptoms.     Harrel Carina Nipomo   OTR/L Pager: [phone] Office: [phone] .
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
## 2011-02-14 NOTE — Progress Notes (Signed)
Neil Sanders (Cheek) [name] PT, DPT Acute Rehabilitation [phone]  

## 2011-02-14 NOTE — Progress Notes (Signed)
Patient ID: Neil Sanders, male   DOB: November 15, 1990, 21 y.o.   MRN: [id number] Subjective:  The patient is alert and pleasant. He is in no apparent distress.  Objective: Vital signs in last 24 hours: Temp:  [97.6 F (36.4 C)-98.7 F (37.1 C)] 98.7 F (37.1 C) (02/11 1400) Pulse Rate:  [54-57] 55  (02/11 1400) Resp:  [18-20] 18  (02/11 1400) BP: (113-124)/(73-80) 124/77 mmHg (02/11 1400) SpO2:  [92 %-100 %] 97 % (02/11 1400)  Intake/Output from previous day: 02/10 0701 - 02/11 0700 In: -  Out: 225 [Urine:225] Intake/Output this shift: Total I/O In: 460 [P.O.:460] Out: 200 [Urine:200]  Physical exam the patient is alert and oriented. He is moving all 4 extremities well.  Lab Results:  Basename 02/13/11 0553  WBC 16.0*  HGB 10.6*  HCT 32.8*  PLT 195   BMET No results found for this basename: NA:2,K:2,CL:2,CO2:2,GLUCOSE:2,BUN:2,CREATININE:2,CALCIUM:2 in the last 72 hours  Studies/Results: Dg Cervical Spine 2-3 Views  02/13/2011  *RADIOLOGY REPORT*  Clinical Data: C2 fracture.  CERVICAL SPINE - 2-3 VIEW  Comparison: CT scan 02/07/2011.  Findings: A displaced fracture through the base of the dens is again demonstrated.  Estimated displacement is 6 mm.  Significant abnormal prevertebral soft tissue swelling/hematoma noted.  The remaining visualized cervical vertebral bodies are intact.  IMPRESSION: Displaced C2 fracture with prevertebral hematoma.  Original Report Authenticated By: P. Loralie Champagne, M.D.    Assessment/Plan: Odontoid fracture: I discussed situation again with the patient to his grandmother and aunt. I have answered all her questions regarding surgery. We will tentatively plan to do the patient's surgery on Wednesday. In meantime, I will get x-ray of his neck to check on the alignment of the dens.  LOS: 7 days     Neil Sanders D 02/14/2011, 4:36 PM
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
## 2011-02-14 NOTE — Progress Notes (Addendum)
Surgery this week by Dr. Lovell Sheehan. Will hold lovenox. Patient examined and I agree with the assessment and plan  Violeta Gelinas, MD, MPH, FACS Pager: [phone]  02/14/2011 9:18 AM

## 2011-02-15 LAB — COMPREHENSIVE METABOLIC PANEL
ALT: 169 U/L — ABNORMAL HIGH (ref 0–53)
AST: 70 U/L — ABNORMAL HIGH (ref 0–37)
CO2: 26 mEq/L (ref 19–32)
Calcium: 9 mg/dL (ref 8.4–10.5)
Sodium: 136 mEq/L (ref 135–145)
Total Protein: 6.5 g/dL (ref 6.0–8.3)

## 2011-02-15 LAB — CBC
HCT: 34.1 % — ABNORMAL LOW (ref 39.0–52.0)
Hemoglobin: 10.6 g/dL — ABNORMAL LOW (ref 13.0–17.0)
Hemoglobin: 11.1 g/dL — ABNORMAL LOW (ref 13.0–17.0)
MCH: 30.2 pg (ref 26.0–34.0)
MCHC: 32.6 g/dL (ref 30.0–36.0)
MCHC: 32.6 g/dL (ref 30.0–36.0)
WBC: 13 10*3/uL — ABNORMAL HIGH (ref 4.0–10.5)

## 2011-02-15 MED ORDER — CEFAZOLIN SODIUM-DEXTROSE 2-3 GM-% IV SOLR
2.0000 g | INTRAVENOUS | Status: AC
  Administered 2011-02-16 (×2): 2 g via INTRAVENOUS
  Filled 2011-02-15: qty 50

## 2011-02-15 MED ORDER — HYDROMORPHONE 0.3 MG/ML IV SOLN
INTRAVENOUS | Status: AC
  Filled 2011-02-15: qty 25

## 2011-02-15 MED ORDER — LORAZEPAM 1 MG PO TABS
1.0000 mg | ORAL_TABLET | Freq: Once | ORAL | Status: AC
  Administered 2011-02-15: 1 mg via ORAL
  Filled 2011-02-15: qty 1

## 2011-02-15 MED ORDER — HYDROMORPHONE 0.3 MG/ML IV SOLN
INTRAVENOUS | Status: AC
  Administered 2011-02-15: 22:00:00 via INTRAVENOUS
  Filled 2011-02-15: qty 25
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
## 2011-02-15 NOTE — Progress Notes (Signed)
Occupational Therapy Treatment Patient Details Name: Neil Sanders MRN: [id number] DOB: October 15, 1990 Today's Date: 02/15/2011  OT Assessment/Plan OT Assessment/Plan Comments on Treatment Session: Pt with increased activity tolerance this session. Pt with some anxiety with mobility and reports fearful of pain with mobility. Pt sit<.> stand this session with increased independence. Pt static standing Min (A). Pt standing for > 3 minutes during session. OT Plan: Discharge plan remains appropriate OT Frequency: Min 2X/week Recommendations for Other Services: Rehab consult Follow Up Recommendations: Inpatient Rehab Equipment Recommended: Other (comment) OT Goals Acute Rehab OT Goals OT Goal Formulation: With patient Time For Goal Achievement: 2 weeks ADL Goals Pt Will Perform Grooming: with supervision;Sitting, edge of bed;Unsupported;Other (comment) ADL Goal: Grooming - Progress: Progressing toward goals Pt Will Perform Upper Body Bathing: with supervision;Sitting, chair;Sitting, edge of bed;Unsupported;Other (comment) ADL Goal: Upper Body Bathing - Progress: Progressing toward goals Pt Will Perform Lower Body Bathing: with supervision;Sitting, edge of bed;Other (comment) ADL Goal: Lower Body Bathing - Progress: Progressing toward goals Pt Will Transfer to Toilet: with min assist;with DME;Squat pivot transfer;3-in-1 ADL Goal: Toilet Transfer - Progress: Progressing toward goals Miscellaneous OT Goals Miscellaneous OT Goal #1: Pt will perform bed mobility with supervision while maintain cervical precautions in prep for EOB ADLs. OT Goal: Miscellaneous Goal #1 - Progress: Progressing toward goals  OT Treatment Precautions/Restrictions  Precautions Precautions: Fall Precaution Comments: cervical precautions Required Braces or Orthoses: Yes Cervical Brace: Hard collar;Other (comment) (with thoracic attachment) Restrictions Weight Bearing Restrictions: No Other Position/Activity  Restrictions: Pt should limit UE weight bearing for cervical precautions but is WBAT   ADL ADL Eating/Feeding: Performed;Set up Eating/Feeding Details (indicate cue type and reason): lunch arrived at end of session Where Assessed - Eating/Feeding: Bed level Upper Body Bathing: Performed;+1 Total assistance Where Assessed - Upper Body Bathing: Sitting, bed;Unsupported Lower Body Bathing: Performed;+1 Total assistance Where Assessed - Lower Body Bathing: Sitting, bed;Unsupported Upper Body Dressing: Performed;Minimal assistance Where Assessed - Upper Body Dressing: Sitting, bed;Unsupported Lower Body Dressing: Performed;+1 Total assistance Lower Body Dressing Details (indicate cue type and reason): don socks Where Assessed - Lower Body Dressing: Sitting, bed;Unsupported Equipment Used: Rolling walker Ambulation Related to ADLs: Pt side stepping to [facility] X2 steps and requesting to return to sitting due to pelvic pain. pt stepping with Rt LE and hestiate with Lt LE ADL Comments: Pt agreeable to EOB sitting for bathing. Pt sitting EOB ~ 30 minutes with Min Guard A. Pt with slight Lt Lean initially. Pt with brace adjusted for proper fit. pt reports night tremors previous night and limited sleep. Pt reports HR in a wide range over the PM hours of 02/14/11. Pt with two small short few beats runs of Vtach during session. Pt was moving during run of vtach at EOB. Pt with sustained heart rate elevation up to 122 during session. Pt HR ranged 80-122. Mobility  Bed Mobility Bed Mobility: Yes Supine to Sit: HOB elevated (Comment degrees);Patient percentage (comment) (pt 70% HOB 45 degrees) Supine to Sit Details (indicate cue type and reason): HOB elevated to decrease strain on neck. Verbal cues for cervical precautions. Assist for Lt. LE, uprighting of trunk Sit to Supine: 1: +2 Total assist;Patient percentage (comment);HOB elevated (comment degrees) (pt 50% hob 30 degrees) Sit to Supine - Details (indicate  cue type and reason): Verbal cues to maintain cervical precautions. Assist with bil. LEs and through trunk. Attempted Rt. sidelying then to supine. Limited by Lt. hip pain.  Scooting to [facility]: 1: +2 Total assist;Patient percentage (comment) (pt  0%) Scooting to [facility] Details (indicate cue type and reason):   Pt able to contribute with Rt. LE  Transfers Transfers: Yes Sit to Stand: 1: +2 Total assist;From bed;Patient percentage (comment) Sit to Stand Details (indicate cue type and reason): pt 75 % Verbal cues for sequencing, UE placement, cervical precautions. PT to appropriately place pt's Bil. LEs. RW in front for support in standing. Pt with improved ability to extend Lt. hip as well as accept weight onto Lt. LE Stand to Sit: 1: +2 Total assist;Patient percentage (comment);To bed Stand to Sit Details: PT 60%  MIN v/c for ue placement Exercises General Exercises - Lower Extremity Ankle Circles/Pumps: AROM;Seated;15 reps Long Arc Quad: AROM;Both;Other reps (comment);Seated (8 reps)  End of Session OT - End of Session Equipment Utilized During Treatment: Back brace;Cervical collar;Gait belt Activity Tolerance: Patient limited by fatigue;Patient limited by pain (pt reports fatigue s/p EOB task) Patient left: in bed;with call bell in reach;with family/visitor present (grandmother) Nurse Communication: Mobility status for transfers General Behavior During Session: [facility] for tasks performed Cognition: [facility] for tasks performed  Lucile Shutters  02/15/2011, 1:24 PM Pager: [phone]

## 2011-02-16 ENCOUNTER — Inpatient Hospital Stay (HOSPITAL_COMMUNITY): Payer: Medicaid Other

## 2011-02-16 ENCOUNTER — Encounter (HOSPITAL_COMMUNITY): Payer: Self-pay | Admitting: Anesthesiology

## 2011-02-16 ENCOUNTER — Inpatient Hospital Stay (HOSPITAL_COMMUNITY): Payer: Medicaid Other | Admitting: Anesthesiology

## 2011-02-16 ENCOUNTER — Encounter (HOSPITAL_COMMUNITY)
Admission: EM | Disposition: A | Discharge status: Other Acute Inpatient Hospital | Payer: Self-pay | Source: Home / Self Care

## 2011-02-16 LAB — BASIC METABOLIC PANEL
Calcium: 9.1 mg/dL (ref 8.4–10.5)
Chloride: 101 mEq/L (ref 96–112)
Creatinine, Ser: 0.5 mg/dL (ref 0.50–1.35)
GFR calc Af Amer: >90 mL/min (ref >90)

## 2011-02-16 LAB — CBC
MCV: 93.3 fL (ref 78.0–100.0)
Platelets: 227 10*3/uL (ref 150–400)
RDW: 15.4 % (ref 11.5–15.5)
WBC: 11.4 10*3/uL — ABNORMAL HIGH (ref 4.0–10.5)

## 2011-02-16 SURGERY — INSERTION, SCREW, ODONTOID
Anesthesia: General | Wound class: Clean

## 2011-02-16 MED ORDER — ACETAMINOPHEN 650 MG RE SUPP: 650.0000 mg | RECTAL | Status: DC | PRN

## 2011-02-16 MED ORDER — HYDROCODONE-ACETAMINOPHEN 5-325 MG PO TABS: 1.0000 | ORAL_TABLET | ORAL | Status: DC | PRN

## 2011-02-16 MED ORDER — DOCUSATE SODIUM 100 MG PO CAPS
200.0000 mg | ORAL_CAPSULE | Freq: Two times a day (BID) | ORAL | Status: DC
  Administered 2011-02-16 – 2011-02-19 (×8): 200 mg via ORAL
  Administered 2011-02-20: 100 mg via ORAL
  Administered 2011-02-20 – 2011-02-24 (×8): 200 mg via ORAL
  Filled 2011-02-16: qty 2
  Filled 2011-02-16: qty 1
  Filled 2011-02-16: qty 2
  Filled 2011-02-16: qty 1
  Filled 2011-02-16 (×7): qty 2
  Filled 2011-02-16 (×2): qty 1
  Filled 2011-02-16 (×4): qty 2

## 2011-02-16 MED ORDER — HYDROMORPHONE 0.3 MG/ML IV SOLN
INTRAVENOUS | Status: AC
  Filled 2011-02-16: qty 25

## 2011-02-16 MED ORDER — FENTANYL CITRATE 0.05 MG/ML IJ SOLN
INTRAMUSCULAR | Status: DC | PRN
  Administered 2011-02-16: 100 ug via INTRAVENOUS
  Administered 2011-02-16 (×6): 50 ug via INTRAVENOUS
  Administered 2011-02-16: 100 ug via INTRAVENOUS

## 2011-02-16 MED ORDER — MORPHINE SULFATE 4 MG/ML IJ SOLN: 1.0000 mg | INTRAMUSCULAR | Status: DC | PRN

## 2011-02-16 MED ORDER — OXYCODONE-ACETAMINOPHEN 5-325 MG PO TABS
1.0000 | ORAL_TABLET | ORAL | Status: DC | PRN
  Filled 2011-02-16: qty 2

## 2011-02-16 MED ORDER — BISACODYL 5 MG PO TBEC
10.0000 mg | DELAYED_RELEASE_TABLET | Freq: Every day | ORAL | Status: DC
  Administered 2011-02-16 – 2011-02-24 (×8): 10 mg via ORAL
  Filled 2011-02-16 (×2): qty 1
  Filled 2011-02-16 (×4): qty 2
  Filled 2011-02-16 (×2): qty 1
  Filled 2011-02-16 (×2): qty 2

## 2011-02-16 MED ORDER — ROCURONIUM BROMIDE 100 MG/10ML IV SOLN
INTRAVENOUS | Status: DC | PRN
  Administered 2011-02-16 (×2): 10 mg via INTRAVENOUS
  Administered 2011-02-16: 5 mg via INTRAVENOUS
  Administered 2011-02-16: 50 mg via INTRAVENOUS

## 2011-02-16 MED ORDER — PROPOFOL 10 MG/ML IV EMUL
INTRAVENOUS | Status: DC | PRN
  Administered 2011-02-16: 130 mg via INTRAVENOUS

## 2011-02-16 MED ORDER — GLYCOPYRROLATE 0.2 MG/ML IJ SOLN
INTRAMUSCULAR | Status: DC | PRN
  Administered 2011-02-16: .8 mg via INTRAVENOUS

## 2011-02-16 MED ORDER — PHENOL 1.4 % MT LIQD: 1.0000 | OROMUCOSAL | Status: DC | PRN

## 2011-02-16 MED ORDER — MENTHOL 3 MG MT LOZG: 1.0000 | LOZENGE | OROMUCOSAL | Status: DC | PRN

## 2011-02-16 MED ORDER — ONDANSETRON HCL 4 MG/2ML IJ SOLN
4.0000 mg | INTRAMUSCULAR | Status: DC | PRN
  Administered 2011-02-18 – 2011-02-22 (×4): 4 mg via INTRAVENOUS
  Filled 2011-02-16 (×3): qty 2

## 2011-02-16 MED ORDER — ISOPROTERENOL HCL 0.2 MG/ML IJ SOLN
2.0000 ug/min | INTRAVENOUS | Status: AC
  Filled 2011-02-16: qty 5

## 2011-02-16 MED ORDER — SUCCINYLCHOLINE CHLORIDE 20 MG/ML IJ SOLN
INTRAMUSCULAR | Status: DC | PRN
  Administered 2011-02-16: 100 mg via INTRAVENOUS

## 2011-02-16 MED ORDER — PANTOPRAZOLE SODIUM 40 MG IV SOLR: 40.0000 mg | Freq: Every day | INTRAVENOUS | Status: DC

## 2011-02-16 MED ORDER — LACTATED RINGERS IV SOLN
INTRAVENOUS | Status: DC | PRN
  Administered 2011-02-16: 17:00:00 via INTRAVENOUS

## 2011-02-16 MED ORDER — HYDROMORPHONE HCL PF 1 MG/ML IJ SOLN
INTRAMUSCULAR | Status: AC
  Filled 2011-02-16: qty 1

## 2011-02-16 MED ORDER — NEOSTIGMINE METHYLSULFATE 1 MG/ML IJ SOLN
INTRAMUSCULAR | Status: DC | PRN
  Administered 2011-02-16: 4 mg via INTRAVENOUS

## 2011-02-16 MED ORDER — ONDANSETRON HCL 4 MG/2ML IJ SOLN
INTRAMUSCULAR | Status: DC | PRN
  Administered 2011-02-16: 4 mg via INTRAVENOUS

## 2011-02-16 MED ORDER — MIDAZOLAM HCL 5 MG/5ML IJ SOLN
INTRAMUSCULAR | Status: DC | PRN
  Administered 2011-02-16: 2 mg via INTRAVENOUS

## 2011-02-16 MED ORDER — DOCUSATE SODIUM 100 MG PO CAPS: 100.0000 mg | ORAL_CAPSULE | Freq: Two times a day (BID) | ORAL | Status: DC

## 2011-02-16 MED ORDER — BACITRACIN 50000 UNITS IM SOLR
INTRAMUSCULAR | Status: DC | PRN
  Administered 2011-02-16: 19:00:00

## 2011-02-16 MED ORDER — BUPIVACAINE-EPINEPHRINE 0.5% -1:200000 IJ SOLN
INTRAMUSCULAR | Status: DC | PRN
  Administered 2011-02-16: 4 mL

## 2011-02-16 MED ORDER — BACITRACIN ZINC 500 UNIT/GM EX OINT
TOPICAL_OINTMENT | CUTANEOUS | Status: DC | PRN
  Administered 2011-02-16: 1 via TOPICAL

## 2011-02-16 MED ORDER — CEFAZOLIN SODIUM-DEXTROSE 2-3 GM-% IV SOLR
INTRAVENOUS | Status: AC
  Administered 2011-02-17: 2 g via INTRAVENOUS
  Filled 2011-02-16: qty 50

## 2011-02-16 MED ORDER — MEPERIDINE HCL 25 MG/ML IJ SOLN: 6.2500 mg | INTRAMUSCULAR | Status: DC | PRN

## 2011-02-16 MED ORDER — 0.9 % SODIUM CHLORIDE (POUR BTL) OPTIME
TOPICAL | Status: DC | PRN
  Administered 2011-02-16: 1000 mL

## 2011-02-16 MED ORDER — BACITRACIN 50000 UNITS IM SOLR
INTRAMUSCULAR | Status: AC
  Filled 2011-02-16: qty 1

## 2011-02-16 MED ORDER — HYDROMORPHONE HCL PF 1 MG/ML IJ SOLN
0.2500 mg | INTRAMUSCULAR | Status: DC | PRN
  Administered 2011-02-16 (×2): 0.5 mg via INTRAVENOUS

## 2011-02-16 MED ORDER — ACETAMINOPHEN 325 MG PO TABS: 650.0000 mg | ORAL_TABLET | ORAL | Status: DC | PRN

## 2011-02-16 MED ORDER — LACTATED RINGERS IV SOLN
INTRAVENOUS | Status: DC
  Administered 2011-02-17: 05:00:00 via INTRAVENOUS

## 2011-02-16 MED ORDER — CEFAZOLIN SODIUM-DEXTROSE 2-3 GM-% IV SOLR
2.0000 g | Freq: Three times a day (TID) | INTRAVENOUS | Status: AC
  Administered 2011-02-17 (×2): 2 g via INTRAVENOUS
  Filled 2011-02-16 (×2): qty 50

## 2011-02-16 MED ORDER — DEXAMETHASONE SODIUM PHOSPHATE 4 MG/ML IJ SOLN
INTRAMUSCULAR | Status: DC | PRN
  Administered 2011-02-16: 8 mg via INTRAVENOUS

## 2011-02-16 MED ORDER — SODIUM CHLORIDE 0.9 % IV SOLN
INTRAVENOUS | Status: AC
  Filled 2011-02-16: qty 500

## 2011-02-16 MED ORDER — DEXAMETHASONE 4 MG PO TABS
4.0000 mg | ORAL_TABLET | Freq: Four times a day (QID) | ORAL | Status: AC
  Administered 2011-02-17 (×2): 4 mg via ORAL
  Filled 2011-02-16 (×2): qty 1

## 2011-02-16 MED ORDER — LACTATED RINGERS IV SOLN
INTRAVENOUS | Status: DC | PRN
  Administered 2011-02-16 (×2): via INTRAVENOUS

## 2011-02-16 MED ORDER — DEXAMETHASONE SODIUM PHOSPHATE 4 MG/ML IJ SOLN: 4.0000 mg | Freq: Four times a day (QID) | INTRAMUSCULAR | Status: AC

## 2011-02-16 MED ORDER — PROMETHAZINE HCL 25 MG/ML IJ SOLN: 6.2500 mg | INTRAMUSCULAR | Status: DC | PRN

## 2011-02-16 MED ORDER — HYDROMORPHONE 0.3 MG/ML IV SOLN
INTRAVENOUS | Status: AC
  Administered 2011-02-16: 0.5 mg via INTRAVENOUS
  Filled 2011-02-16: qty 25

## 2011-02-16 MED ORDER — THROMBIN 20000 UNITS EX KIT
PACK | CUTANEOUS | Status: DC | PRN
  Administered 2011-02-16: 19:00:00 via TOPICAL

## 2011-02-16 SURGICAL SUPPLY — 52 items
BENZOIN TINCTURE PRP APPL 2/3 (GAUZE/BANDAGES/DRESSINGS) ×2 IMPLANT
BIT DRILL UCSC CANN STRL (BIT) ×1 IMPLANT
BLADE SURG ROTATE 9660 (MISCELLANEOUS) IMPLANT
CANISTER SUCTION 2500CC (MISCELLANEOUS) ×2 IMPLANT
CLOTH BEACON ORANGE TIMEOUT ST (SAFETY) ×2 IMPLANT
CONT SPEC 4OZ CLIKSEAL STRL BL (MISCELLANEOUS) ×2 IMPLANT
DRAPE C-ARM 42X72 X-RAY (DRAPES) ×6 IMPLANT
DRAPE LAPAROTOMY 100X72 PEDS (DRAPES) ×2 IMPLANT
DRAPE MICROSCOPE LEICA (MISCELLANEOUS) IMPLANT
DRAPE POUCH INSTRU U-SHP 10X18 (DRAPES) ×2 IMPLANT
DRAPE PROXIMA HALF (DRAPES) IMPLANT
DRILL BIT UCSC CANN STERILE (BIT) ×1
ELECT BLADE 4.0 EZ CLEAN MEGAD (MISCELLANEOUS) ×2
ELECT REM PT RETURN 9FT ADLT (ELECTROSURGICAL) ×2
ELECTRODE BLDE 4.0 EZ CLN MEGD (MISCELLANEOUS) ×1 IMPLANT
ELECTRODE REM PT RTRN 9FT ADLT (ELECTROSURGICAL) ×1 IMPLANT
GAUZE SPONGE 4X4 16PLY XRAY LF (GAUZE/BANDAGES/DRESSINGS) IMPLANT
GLOVE BIO SURGEON STRL SZ 6.5 (GLOVE) ×4 IMPLANT
GLOVE BIO SURGEON STRL SZ8.5 (GLOVE) ×2 IMPLANT
GLOVE BIOGEL PI IND STRL 6.5 (GLOVE) ×1 IMPLANT
GLOVE BIOGEL PI IND STRL 7.5 (GLOVE) ×1 IMPLANT
GLOVE BIOGEL PI INDICATOR 6.5 (GLOVE) ×1
GLOVE BIOGEL PI INDICATOR 7.5 (GLOVE) ×1
GLOVE ECLIPSE 7.5 STRL STRAW (GLOVE) ×4 IMPLANT
GLOVE EXAM NITRILE LRG STRL (GLOVE) IMPLANT
GLOVE EXAM NITRILE MD LF STRL (GLOVE) ×2 IMPLANT
GLOVE EXAM NITRILE XL STR (GLOVE) IMPLANT
GLOVE EXAM NITRILE XS STR PU (GLOVE) IMPLANT
GLOVE SS BIOGEL STRL SZ 8 (GLOVE) ×1 IMPLANT
GLOVE SUPERSENSE BIOGEL SZ 8 (GLOVE) ×1
GOWN BRE IMP SLV AUR LG STRL (GOWN DISPOSABLE) ×2 IMPLANT
GOWN BRE IMP SLV AUR XL STRL (GOWN DISPOSABLE) ×4 IMPLANT
GUIDEWIRE USC 24 THREADED (WIRE) ×1 IMPLANT
GUIDEWIRE USC THREADED (WIRE) ×2
KIT BASIN OR (CUSTOM PROCEDURE TRAY) ×2 IMPLANT
KIT ROOM TURNOVER OR (KITS) ×2 IMPLANT
NEEDLE HYPO 25X1 1.5 SAFETY (NEEDLE) ×2 IMPLANT
NS IRRIG 1000ML POUR BTL (IV SOLUTION) ×2 IMPLANT
PACK LAMINECTOMY NEURO (CUSTOM PROCEDURE TRAY) ×2 IMPLANT
RUBBERBAND STERILE (MISCELLANEOUS) IMPLANT
SCREW 36MM (Screw) ×2 IMPLANT
SPONGE GAUZE 4X4 12PLY (GAUZE/BANDAGES/DRESSINGS) ×2 IMPLANT
SPONGE INTESTINAL PEANUT (DISPOSABLE) ×6 IMPLANT
STRIP CLOSURE SKIN 1/2X4 (GAUZE/BANDAGES/DRESSINGS) ×2 IMPLANT
SUT VIC AB 0 CT1 27 (SUTURE) ×1
SUT VIC AB 0 CT1 27XBRD ANTBC (SUTURE) ×1 IMPLANT
SUT VIC AB 3-0 SH 8-18 (SUTURE) ×4 IMPLANT
SYR 20ML ECCENTRIC (SYRINGE) ×2 IMPLANT
TAPE CLOTH SURG 4X10 WHT LF (GAUZE/BANDAGES/DRESSINGS) ×2 IMPLANT
TOWEL OR 17X24 6PK STRL BLUE (TOWEL DISPOSABLE) ×2 IMPLANT
TOWEL OR 17X26 10 PK STRL BLUE (TOWEL DISPOSABLE) ×2 IMPLANT
WATER STERILE IRR 1000ML POUR (IV SOLUTION) ×2 IMPLANT

## 2011-02-16 NOTE — Progress Notes (Signed)
Patient discussed at the Long Length of Stay Neil Sanders 02/16/2011  

## 2011-02-16 NOTE — Anesthesia Preprocedure Evaluation (Addendum)
Anesthesia Evaluation  Patient identified by MRN, date of birth, ID band Patient awake    Reviewed: Allergy & Precautions, H&P , NPO status , Patient's Chart, lab work & pertinent test results  Airway Mallampati: III    Mouth opening: Limited Mouth Opening  Dental   Pulmonary asthma , Current Smoker,  clear to auscultation        Cardiovascular + dysrhythmias + Systolic murmurs History of VSD, Pulmonary Hypertension, complete heart block evaluated this admission by Dr. Johney Frame. Case discussed in detail with Dr. Luberta Robertson, and Dr. Lindie Spruce concerning above and need to proceed with surgery without pacemaker secondary to the risks involved as paer Dr. Johney Frame. On exam murmur heard and sinus bradycardia noted. Echo results noted from 02/08/2011. Patient presently denies chest pain or SOB.   Neuro/Psych Anxiety Depression    GI/Hepatic Neg liver ROS, GERD-  Medicated,  Endo/Other  Negative Endocrine ROS  Renal/GU negative Renal ROS     Musculoskeletal   Abdominal   Peds  Hematology negative hematology ROS (+)   Anesthesia Other Findings   Reproductive/Obstetrics                         Anesthesia Physical Anesthesia Plan  ASA: III  Anesthesia Plan: General   Post-op Pain Management:    Induction: Intravenous  Airway Management Planned: Video Laryngoscope Planned and Oral ETT  Additional Equipment: Arterial line  Intra-op Plan:   Post-operative Plan: Extubation in OR  Informed Consent: I have reviewed the patients History and Physical, chart, labs and discussed the procedure including the risks, benefits and alternatives for the proposed anesthesia with the patient or authorized representative who has indicated his/her understanding and acceptance.   Dental advisory given  Plan Discussed with: CRNA, Anesthesiologist and Surgeon  Anesthesia Plan Comments:         Anesthesia Quick  Evaluation

## 2011-02-16 NOTE — Progress Notes (Signed)
Subjective:  The patient is alert and pleasant. He looks well. He is in no apparent distress.  Objective: Vital signs in last 24 hours: Temp:  [98.2 F (36.8 C)-98.6 F (37 C)] 98.5 F (36.9 C) (02/13 1400) Pulse Rate:  [55-69] 69  (02/13 1400) Resp:  [17-22] 20  (02/13 1549) BP: (107-118)/(72-78) 108/72 mmHg (02/13 1400) SpO2:  [85 %-100 %] 92 % (02/13 1549)  Intake/Output from previous day: 02/12 0701 - 02/13 0700 In: 530 [P.O.:240; I.V.:240; IV Piggyback:50] Out: 500 [Urine:500] Intake/Output this shift: Total I/O In: 600 [I.V.:600] Out: 25 [Blood:25]  Physical exam the patient is moving all 4 extremities well. He is alert and oriented. His dressing is clean and dry without evidence of hematoma or shift.  Lab Results:  [facility] 02/16/11 0607 02/15/11 2115  WBC 11.4* 10.7*  HGB 11.1* 11.1*  HCT 34.6* 34.1*  PLT 227 211   BMET  Basename 02/16/11 0607 02/15/11 2115  NA 137 136  K 3.8 3.4*  CL 101 100  CO2 25 26  GLUCOSE 103* 105*  BUN 8 8  CREATININE 0.50 0.51  CALCIUM 9.1 9.0    Studies/Results: No results found.  Assessment/Plan: The patient is doing well.  LOS: 9 days     Alka Falwell D 02/16/2011, 8:05 PM

## 2011-02-16 NOTE — Transfer of Care (Signed)
Immediate Anesthesia Transfer of Care Note  Patient: Neil Sanders  Procedure(s) Performed: Procedure(s) (LRB): ODONTOID SCREW INSERTION (N/A)  Patient Location: PACU  Anesthesia Type: General  Level of Consciousness: awake, alert  and oriented  Airway & Oxygen Therapy: Patient Spontanous Breathing and Patient connected to nasal cannula oxygen  Post-op Assessment: Report given to PACU RN, Post -op Vital signs reviewed and stable and Patient moving all extremities X 4  Post vital signs: Reviewed and stable  Complications: No apparent anesthesia complications
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
## 2011-02-16 NOTE — Progress Notes (Signed)
PT Cancellation Note  Pt due for cervical spine surgery today, desires to rest. MD - Please make activity orders clear  for PT post-op. Thanks!  Neil Sanders (Neil Sanders) Neil Sanders PT, DPT Acute Rehabilitation [phone]
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
## 2011-02-16 NOTE — Anesthesia Postprocedure Evaluation (Signed)
  Anesthesia Post-op Note  Patient: Neil Sanders  Procedure(s) Performed: Procedure(s) (LRB): ODONTOID SCREW INSERTION (N/A)  Patient Location: PACU  Anesthesia Type: General  Level of Consciousness: awake  Airway and Oxygen Therapy: Patient Spontanous Breathing and Patient connected to nasal cannula oxygen  Post-op Pain: mild  Post-op Assessment: Post-op Vital signs reviewed, Patient's Cardiovascular Status Stable, Respiratory Function Stable and Patent Airway  Post-op Vital Signs: Reviewed and stable  Complications: No apparent anesthesia complications

## 2011-02-16 NOTE — Progress Notes (Signed)
UR of chart complete.  
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
## 2011-02-16 NOTE — Anesthesia Procedure Notes (Signed)
Performed by: Eathen Budreau Z       

## 2011-02-16 NOTE — Progress Notes (Signed)
I have read the note of Dr. Johney Frame, and understand his concerns.  From an overall trauma standpoint it is okay to proceed.  Dr. Lovell Sheehan has to decide if the need for surgery now is appropriate.  I believe that if Dr. Johney Frame feels that this can be managed with Zoll pads and Isuprel (as a precaution) then we should be able to proceed with surgery.  This patient has been seen and I agree with the findings and treatment plan.  Marta Lamas. Gae Bon, MD, FACS [phone] (pager) [phone] (direct pager) Trauma Surgeon
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
## 2011-02-16 NOTE — Progress Notes (Signed)
OT Cancellation Note  Treatment cancelled today due to:  Pt with cervical surg today. MD - PLEASE REORDER OT s/p surg if appropriate.  Thank you  Lucile Shutters   OTR/L Pager: [phone] Office: [phone] .

## 2011-02-17 LAB — CBC
MCH: 29.6 pg (ref 26.0–34.0)
MCV: 91 fL (ref 78.0–100.0)
Platelets: 276 10*3/uL (ref 150–400)
RDW: 14.8 % (ref 11.5–15.5)

## 2011-02-17 MED ORDER — HYDROMORPHONE HCL PF 1 MG/ML IJ SOLN
0.5000 mg | INTRAMUSCULAR | Status: DC | PRN
  Administered 2011-02-17 (×2): 0.5 mg via INTRAVENOUS
  Filled 2011-02-17 (×2): qty 1

## 2011-02-17 MED ORDER — MAGNESIUM CITRATE PO SOLN
1.0000 | Freq: Two times a day (BID) | ORAL | Status: DC
  Filled 2011-02-17 (×8): qty 296

## 2011-02-17 MED ORDER — MAGNESIUM CITRATE PO SOLN
1.0000 | Freq: Two times a day (BID) | ORAL | Status: DC
  Administered 2011-02-17 – 2011-02-19 (×6): 1 via ORAL
  Filled 2011-02-17 (×10): qty 296

## 2011-02-17 MED ORDER — TRAMADOL HCL 50 MG PO TABS
100.0000 mg | ORAL_TABLET | Freq: Four times a day (QID) | ORAL | Status: DC
  Administered 2011-02-17 – 2011-02-24 (×21): 100 mg via ORAL
  Filled 2011-02-17 (×33): qty 2

## 2011-02-17 MED ORDER — HYDROMORPHONE HCL PF 1 MG/ML IJ SOLN
1.0000 mg | INTRAMUSCULAR | Status: DC | PRN
  Administered 2011-02-17 – 2011-02-24 (×27): 1 mg via INTRAVENOUS
  Filled 2011-02-17 (×27): qty 1

## 2011-02-17 MED ORDER — OXYCODONE HCL 5 MG PO TABS
10.0000 mg | ORAL_TABLET | ORAL | Status: DC | PRN
  Administered 2011-02-17 – 2011-02-18 (×4): 20 mg via ORAL
  Administered 2011-02-18 (×2): 10 mg via ORAL
  Administered 2011-02-18 – 2011-02-20 (×6): 20 mg via ORAL
  Administered 2011-02-20: 15 mg via ORAL
  Administered 2011-02-20: 20 mg via ORAL
  Administered 2011-02-20: 5 mg via ORAL
  Administered 2011-02-20: 15 mg via ORAL
  Administered 2011-02-21 (×3): 20 mg via ORAL
  Administered 2011-02-21: 15 mg via ORAL
  Administered 2011-02-22 (×3): 20 mg via ORAL
  Administered 2011-02-23: 15 mg via ORAL
  Administered 2011-02-23 – 2011-02-24 (×2): 20 mg via ORAL
  Filled 2011-02-17 (×2): qty 4
  Filled 2011-02-17: qty 2
  Filled 2011-02-17: qty 3
  Filled 2011-02-17 (×8): qty 4
  Filled 2011-02-17: qty 2
  Filled 2011-02-17: qty 3
  Filled 2011-02-17 (×3): qty 4
  Filled 2011-02-17: qty 2
  Filled 2011-02-17 (×2): qty 4
  Filled 2011-02-17: qty 1
  Filled 2011-02-17 (×6): qty 4

## 2011-02-17 MED ORDER — HYDROMORPHONE 0.3 MG/ML IV SOLN
INTRAVENOUS | Status: AC
  Administered 2011-02-17: 0.3 mg via INTRAVENOUS
  Filled 2011-02-17: qty 25
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
## 2011-02-17 NOTE — Progress Notes (Signed)
Occupational Therapy Treatment Patient Details Name: Neil Sanders MRN: [id number] DOB: Sep 30, 1990 Today's Date: 02/17/2011  OT Assessment/Plan OT Assessment/Plan Comments on Treatment Session: Pt tolerating supine<>sit EOB with increased confidence. pt sitting eob with some dizziness with initial supine<>sit with stable BP. Question vestibular deficits. Pt with stable HR during session. OT Plan: Discharge plan remains appropriate OT Frequency: Min 2X/week Recommendations for Other Services: Rehab consult Follow Up Recommendations: Inpatient Rehab Equipment Recommended: Other (comment) OT Goals Acute Rehab OT Goals OT Goal Formulation: With patient Time For Goal Achievement: 2 weeks ADL Goals Pt Will Perform Grooming: with supervision;Sitting, edge of bed;Unsupported;Other (comment) ADL Goal: Grooming - Progress: Progressing toward goals Pt Will Perform Upper Body Bathing: with supervision;Sitting, chair;Sitting, edge of bed;Unsupported;Other (comment) ADL Goal: Upper Body Bathing - Progress: Progressing toward goals Pt Will Perform Lower Body Bathing: with supervision;Sitting, edge of bed;Other (comment) ADL Goal: Lower Body Bathing - Progress: Progressing toward goals Pt Will Transfer to Toilet: with min assist;with DME;Squat pivot transfer;3-in-1 ADL Goal: Toilet Transfer - Progress: Progressing toward goals Miscellaneous OT Goals Miscellaneous OT Goal #1: Pt will perform bed mobility with supervision while maintain cervical precautions in prep for EOB ADLs. OT Goal: Miscellaneous Goal #1 - Progress: Progressing toward goals  OT Treatment Precautions/Restrictions  Precautions Precautions: Fall Precaution Comments: cervical precautions Required Braces or Orthoses: Yes Cervical Brace: Hard collar;Other (comment) Restrictions Weight Bearing Restrictions: No Other Position/Activity Restrictions: Pt should limit UE weight bearing for cervical precautions but is WBAT    ADL ADL Eating/Feeding: Performed;Set up Eating/Feeding Details (indicate cue type and reason): breakfast arriving during session. girlfriend setting up tray Where Assessed - Eating/Feeding: Bed level Equipment Used: Rolling walker Ambulation Related to ADLs: Pt side stepping to [facility] X2 steps and requesting to return to sitting due to pelvic pain. pt stepping with Rt LE and hestiate with Lt LE ADL Comments: Pt agreeable to EOB sitting for bathing. Pt sitting EOB ~ 25 minutes with Min Guard A. Pt with thoracic position of brace doff and hygiene performed under brace. pt less anxious this session and progressing. Pt sitting EOB with increased confidence. Pt side stepping to [facility] with increased confidence accompanied by pain. Hr stable during session 55-80s Mobility  Bed Mobility Bed Mobility: Yes Supine to Sit: HOB elevated (Comment degrees);Patient percentage (comment);1: +2 Total assist (30 degrees PT 60%.) Supine to Sit Details (indicate cue type and reason): pt crossing UE and holding therapist forearm to help (A) with lifting upper body off bed surface. PT requires (A) for BIL LE due to pelvic pain.  Sit to Supine: 1: +2 Total assist;Patient percentage (comment);HOB elevated (comment degrees) Sit to Supine - Details (indicate cue type and reason): HOB 20 degrees. v/c for sequence. Pt with increased particiation this session Transfers Transfers: Yes Sit to Stand: 1: +2 Total assist;With upper extremity assist;From bed (Pt 60%) Sit to Stand Details (indicate cue type and reason): RW in front for support in standing. Pt with decreased ability to fully extend Lt. hip.  Stand to Sit: 3: Mod assist;With upper extremity assist;To bed Exercises    End of Session OT - End of Session Equipment Utilized During Treatment: Gait belt;Back brace;Cervical collar Activity Tolerance: Patient tolerated treatment well Patient left: in bed;with call bell in reach;with family/visitor present  (girlfriend) Nurse Communication: Mobility status for transfers General Behavior During Session: [facility] for tasks performed Cognition: [facility] for tasks performed  Lucile Shutters  02/17/2011, 2:17 PM Pager: [phone]
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
## 2011-02-17 NOTE — Progress Notes (Signed)
Brittanni Cariker, MD, MPH, FACS Pager: [phone]  

## 2011-02-17 NOTE — Progress Notes (Signed)
Clinical Social Worker met with patient at the bedside to offer support.  Patient shows signs of lethargy, therefore, CSW to complete SBIRT at a later date.  Physical therapy has worked with the patient today, no recommendations noted at this time.   Per patient, he is scheduled to be transferred to [facility] on Monday.  Patient states that he would like to go home for a few days before being transferred.  Patient states that family is planning on coming this evening to visit.  Clinical Social Worker will continue to follow and offer support as needed.  Arnette Norris, MSW Intern   [place], [place] [phone]

## 2011-02-17 NOTE — Progress Notes (Signed)
Ancef 2g in 50 mL due 2/14 at 10:00 was hanging but had not been activated and therefore had not been given.  IV bag activated and dose given starting at 19:45 on 2/14.

## 2011-02-18 ENCOUNTER — Inpatient Hospital Stay (HOSPITAL_COMMUNITY): Payer: Medicaid Other

## 2011-02-18 LAB — CBC
Hemoglobin: 8.2 g/dL — ABNORMAL LOW (ref 13.0–17.0)
MCH: 30 pg (ref 26.0–34.0)
MCHC: 32.9 g/dL (ref 30.0–36.0)
MCV: 91.2 fL (ref 78.0–100.0)
Platelets: 257 10*3/uL (ref 150–400)
RBC: 2.73 MIL/uL — ABNORMAL LOW (ref 4.22–5.81)
WBC: 13.6 10*3/uL — ABNORMAL HIGH (ref 4.0–10.5)

## 2011-02-18 NOTE — Progress Notes (Signed)
Notified [facility] of potential transfer next week of pt to [facility].  Will need accepting MD and room number prior to transfer.

## 2011-02-18 NOTE — Progress Notes (Addendum)
Occupational Therapy Treatment Patient Details Name: Neil Sanders MRN: [id number] DOB: [birth year] Today's Date: 02/18/2011  OT Assessment/Plan OT Assessment/Plan Comments on Treatment Session: Pt premedicated with addition medication from RN Consuella Lose s/p chest xray. Pt with severe pain and nausea with siitting. Pt symptomic and returned to supine. RN Consuella Lose made aware. Pt had requestied beside commode at beginning of session. recommend bed pan due to symptoms OT Plan: Discharge plan remains appropriate OT Frequency: Min 2X/week Recommendations for Other Services: Rehab consult Follow Up Recommendations: Inpatient Rehab Equipment Recommended: Defer to next venue OT Goals Acute Rehab OT Goals OT Goal Formulation: With patient Time For Goal Achievement: 2 weeks ADL Goals Pt Will Perform Grooming: with supervision;Sitting, edge of bed;Unsupported;Other (comment) ADL Goal: Grooming - Progress: Not met Pt Will Perform Upper Body Bathing: with supervision;Sitting, chair;Sitting, edge of bed;Unsupported;Other (comment) ADL Goal: Upper Body Bathing - Progress: Not met Pt Will Perform Lower Body Bathing: with supervision;Sitting, edge of bed;Other (comment) ADL Goal: Lower Body Bathing - Progress: Not met Pt Will Transfer to Toilet: with min assist;with DME;Squat pivot transfer;3-in-1 ADL Goal: Toilet Transfer - Progress: Not met Miscellaneous OT Goals Miscellaneous OT Goal #1: Pt will perform bed mobility with supervision while maintain cervical precautions in prep for EOB ADLs. OT Goal: Miscellaneous Goal #1 - Progress: Not met  OT Treatment Precautions/Restrictions  Precautions Precautions: Fall Precaution Comments: cervical precautions Required Braces or Orthoses: Yes Cervical Brace: Hard collar;Other (comment) Restrictions Weight Bearing Restrictions: No Other Position/Activity Restrictions: Pt should limit UE weight bearing for cervical precautions but is WBAT    ADL ADL Ambulation Related to ADLs: not attempted at this time due to pain  ADL Comments: Pt with back brace and cervical collar reapplied during session. Pt supine<>sit with dizziness. Question vestibular no nystagimus noted at this time. pt with progressive nausea. Pt with pallor look and requesting to return to supine. Pt apologizing for return to supine and states "i just dont feel good today" Pt attempting to participate but limited by nausea/ fatigue/pain. pt with HR 55-57 entire session. BP 123/82 with sitting. Mobility  Bed Mobility Bed Mobility: Yes Rolling Right: 1: +2 Total assist;Patient percentage (comment);With rail Rolling Right Details (indicate cue type and reason): (A) to initiate and complete roll with cues for hand placement. Pt crossing UE against therapist forearm to initiate roll. Rolling Left: 1: +2 Total assist;Patient percentage (comment) Rolling Left Details (indicate cue type and reason): pt 30% (A) to complete roll Supine to Sit: 1: +2 Total assist;HOB elevated (Comment degrees) (HOB 30 degrees Pt 60%) Supine to Sit Details (indicate cue type and reason):   pt crossing UE and holding therapist forearm to help (A) with lifting upper body off bed surface. PT requires (A) for BIL LE due to pelvic pain.  Sitting - Scoot to Edge of Bed: 2: Max assist Sit to Supine: 1: +2 Total assist;Patient percentage (comment) (pt 30%) Sit to Supine - Details (indicate cue type and reason): pt reports discomfort in Lt wrist s/p A-line d/c site Transfers Transfers: No Exercises    End of Session OT - End of Session Equipment Utilized During Treatment: Cervical collar;Back brace Activity Tolerance: Patient limited by pain;Patient limited by fatigue Patient left: in bed;with call bell in reach;with family/visitor present General Behavior During Session: [facility] for tasks performed Cognition: [facility] for tasks performed  Pt seen twice see treatment notes for further details.  (seen  8:26AM and @ 9:04AM)  Harrel Carina [facility]  02/18/2011, 12:39 PM Pager: [phone]
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
## 2011-02-18 NOTE — Progress Notes (Signed)
Having a lot of pain Patient examined and I agree with the assessment and plan  Violeta Gelinas, MD, MPH, FACS Pager: [phone]  02/18/2011 8:46 AM
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
## 2011-02-19 DIAGNOSIS — R0989 Other specified symptoms and signs involving the circulatory and respiratory systems: Secondary | ICD-10-CM

## 2011-02-19 LAB — URINALYSIS, ROUTINE W REFLEX MICROSCOPIC
Bilirubin Urine: NEGATIVE
Glucose, UA: NEGATIVE mg/dL
Hgb urine dipstick: NEGATIVE
Ketones, ur: NEGATIVE mg/dL
Leukocytes, UA: NEGATIVE
Nitrite: NEGATIVE
Protein, ur: NEGATIVE mg/dL
Specific Gravity, Urine: 1.019 (ref 1.005–1.030)
Urobilinogen, UA: 1 mg/dL (ref 0.0–1.0)
pH: 5.5 (ref 5.0–8.0)

## 2011-02-19 LAB — BASIC METABOLIC PANEL
CO2: 27 mEq/L (ref 19–32)
Chloride: 99 mEq/L (ref 96–112)
Creatinine, Ser: 0.64 mg/dL (ref 0.50–1.35)
GFR calc Af Amer: >90 mL/min (ref >90)
Potassium: 3.7 mEq/L (ref 3.5–5.1)
Sodium: 136 mEq/L (ref 135–145)

## 2011-02-19 LAB — CBC
MCV: 92.9 fL (ref 78.0–100.0)
Platelets: 356 10*3/uL (ref 150–400)
RBC: 3.97 MIL/uL — ABNORMAL LOW (ref 4.22–5.81)
RDW: 15.3 % (ref 11.5–15.5)
WBC: 16 10*3/uL — ABNORMAL HIGH (ref 4.0–10.5)

## 2011-02-19 LAB — MAGNESIUM: Magnesium: 2.2 mg/dL (ref 1.5–2.5)

## 2011-02-19 MED ORDER — CIPROFLOXACIN HCL 500 MG PO TABS
500.0000 mg | ORAL_TABLET | Freq: Two times a day (BID) | ORAL | Status: DC
  Administered 2011-02-19 – 2011-02-22 (×7): 500 mg via ORAL
  Filled 2011-02-19 (×9): qty 1

## 2011-02-19 NOTE — Progress Notes (Signed)
Doing well. C/o appropriate incisional soreness.  No Numbness, tingling, weakness No Nausea /vomiting Amb/ voiding well  Temp:  [97 F (36.1 C)-97.5 F (36.4 C)] 97.5 F (36.4 C) (02/16 0626) Pulse Rate:  [51-54] 51  (02/16 0626) Resp:  [16-22] 20  (02/16 0626) BP: (109-124)/(56-79) 116/77 mmHg (02/16 0626) SpO2:  [93 %-100 %] 99 % (02/16 0626) FiO2 (%):  [2 %] 2 % (02/15 1904) Good strength and sensation Incision CDI  Plan: Pt stable, CPM
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
## 2011-02-20 LAB — BASIC METABOLIC PANEL
BUN: 11 mg/dL (ref 6–23)
CO2: 27 mEq/L (ref 19–32)
Chloride: 101 mEq/L (ref 96–112)
Creatinine, Ser: 0.65 mg/dL (ref 0.50–1.35)
Glucose, Bld: 95 mg/dL (ref 70–99)

## 2011-02-20 LAB — URINE CULTURE: Colony Count: NO GROWTH

## 2011-02-20 LAB — CBC
HCT: 35.4 % — ABNORMAL LOW (ref 39.0–52.0)
MCV: 94.4 fL (ref 78.0–100.0)
RBC: 3.75 MIL/uL — ABNORMAL LOW (ref 4.22–5.81)
RDW: 15.5 % (ref 11.5–15.5)
WBC: 13.1 10*3/uL — ABNORMAL HIGH (ref 4.0–10.5)

## 2011-02-20 MED ORDER — BISACODYL 10 MG RE SUPP
10.0000 mg | Freq: Every day | RECTAL | Status: DC | PRN
  Administered 2011-02-22: 10 mg via RECTAL
  Filled 2011-02-20: qty 1

## 2011-02-20 MED ORDER — FLEET ENEMA 7-19 GM/118ML RE ENEM: 1.0000 | ENEMA | Freq: Every day | RECTAL | Status: DC | PRN
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
## 2011-02-20 NOTE — Progress Notes (Signed)
ua neg.  Looks ok. Just got thru with therapies  - wiped out.  I saw the patient, participated in the history, exam and medical decision making, and concur with the physician assistant's note above.  Mary Sella. Andrey Campanile, MD, FACS General, Bariatric, & Minimally Invasive Surgery [facility], [place]
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
## 2011-02-20 NOTE — Progress Notes (Signed)
Physical Therapy Treatment Patient Details Name: Neil Sanders MRN: [id number] DOB: 06/17/1990 Today's Date: 02/20/2011  PT Assessment/Plan  PT - Assessment/Plan Comments on Treatment Session: Today's session limited due to pt requesting increased time on BSC.  Improved with mobility as evident in requiring decreased assistance.   PT Plan: Discharge plan remains appropriate PT Frequency: Min 6X/week Follow Up Recommendations: Inpatient Rehab;Supervision for mobility/OOB Equipment Recommended: Defer to next venue PT Goals  Acute Rehab PT Goals PT Goal: Supine/Side to Sit - Progress: Progressing toward goal PT Goal: Sit to Stand - Progress: Progressing toward goal PT Goal: Stand to Sit - Progress: Progressing toward goal PT Goal: Ambulate - Progress: Progressing toward goal  PT Treatment Precautions/Restrictions  Precautions Precautions: Fall Precaution Comments: cervical precautions Required Braces or Orthoses: Yes Cervical Brace: Hard collar Restrictions Weight Bearing Restrictions: No Other Position/Activity Restrictions: Pt should limit UE weight bearing for cervical precautions but is WBAT Mobility (including Balance) Bed Mobility Supine to Sit: 1: +2 Total assist;HOB elevated (Comment degrees) (HOB elevated ~30 degrees.  pt=70%. ) Supine to Sit Details (indicate cue type and reason):  Pt declines supine>sidelying>sit due to pain.  Pt continues to cross UE's and hold therapist forearm to help (A) with lifting upper body off bed surface.  (A) required to bring LE's around to EOB with use of draw pad but did really well with initiating & performing as much as he could by himself.  Cues for technique & UE positioning/use to increase ease of transition.   Sitting - Scoot to Edge of Bed: 3: Mod assist Sitting - Scoot to Edge of Bed Details (indicate cue type and reason): use of draw pad to scoot hips closer to EOB.  Cues for sequencing & technique.   Transfers Sit to Stand: 1: +2  Total assist;From bed;Other (comment) (pt=60%) Sit to Stand Details (indicate cue type and reason): Pt positions UE's on RW at (A) with standing stating "that's how I've been doing it".  (A) to achieve standing & balance.   Stand to Sit: To chair/3-in-1;With upper extremity assist;With armrests;4: Min assist Stand to Sit Details: Cues for hand placement & technique.   Ambulation/Gait Ambulation/Gait Assistance: 4: Min assist Ambulation/Gait Assistance Details (indicate cue type and reason): (A) for balance & safety.  Pt took approx 4 steps forwards & 4 steps backwards.  Distance limited due to pt requesting to use BSC.   Assistive device: Rolling walker Gait Pattern: Decreased step length - right;Decreased step length - left Stairs: No Wheelchair Mobility Wheelchair Mobility: No  Posture/Postural Control Posture/Postural Control: No significant limitations Exercise    End of Session PT - End of Session Equipment Utilized During Treatment: Cervical collar Activity Tolerance: Patient tolerated treatment well;Patient limited by pain Patient left: with family/visitor present;Other (comment) (requesting increased time to use bathroom) Nurse Communication: Mobility status for transfers;Mobility status for ambulation General Behavior During Session: [facility] for tasks performed Cognition: [facility] for tasks performed  Lara Mulch 02/20/2011, 11:36 AM [phone]

## 2011-02-21 LAB — CBC
HCT: 36.5 % — ABNORMAL LOW (ref 39.0–52.0)
MCH: 29.9 pg (ref 26.0–34.0)
MCHC: 32.1 g/dL (ref 30.0–36.0)
MCV: 93.4 fL (ref 78.0–100.0)
RDW: 15.9 % — ABNORMAL HIGH (ref 11.5–15.5)

## 2011-02-21 NOTE — Progress Notes (Signed)
Occupational Therapy Treatment Patient Details Name: Neil Sanders MRN: [id number] DOB: July 05, 1990 Today's Date: 02/21/2011  OT Assessment/Plan OT Assessment/Plan Comments on Treatment Session: Pt premedicated and informed of therapy arrival. Pt with improved mobility this session compared to previous sessions. Pt requries frequent restbreaks and verbal encouragement throughout session. OT Plan: Discharge plan remains appropriate OT Frequency: Min 2X/week Recommendations for Other Services: Rehab consult Follow Up Recommendations: Inpatient Rehab Equipment Recommended: Defer to next venue OT Goals Acute Rehab OT Goals OT Goal Formulation: With patient Time For Goal Achievement: 2 weeks ADL Goals Pt Will Perform Grooming: with supervision;Sitting, edge of bed;Unsupported;Other (comment) ADL Goal: Grooming - Progress: Progressing toward goals Pt Will Perform Upper Body Bathing: with supervision;Sitting, chair;Sitting, edge of bed;Unsupported;Other (comment) ADL Goal: Upper Body Bathing - Progress: Progressing toward goals Pt Will Perform Lower Body Bathing: with supervision;Sitting, edge of bed;Other (comment) ADL Goal: Lower Body Bathing - Progress: Not met Pt Will Transfer to Toilet: with min assist;with DME;Squat pivot transfer;3-in-1 ADL Goal: Toilet Transfer - Progress: Progressing toward goals Miscellaneous OT Goals Miscellaneous OT Goal #1: Pt will perform bed mobility with supervision while maintain cervical precautions in prep for EOB ADLs. OT Goal: Miscellaneous Goal #1 - Progress: Met  OT Treatment Precautions/Restrictions  Precautions Precautions: Fall Precaution Comments: cervical precautions Required Braces or Orthoses: Yes Cervical Brace: Hard collar Restrictions Weight Bearing Restrictions: No Other Position/Activity Restrictions: Pt should limit UE weight bearing for cervical precautions but is WBAT   ADL ADL Eating/Feeding: Performed;Set up Eating/Feeding  Details (indicate cue type and reason): sitting in chair for lunch. Pt with poor appetite. Requesting cereal and fruit with lunch tray arrival.  Where Assessed - Eating/Feeding: Chair Upper Body Dressing: Performed;Set up Upper Body Dressing Details (indicate cue type and reason): don gown as robe Where Assessed - Upper Body Dressing: Sitting, bed;Unsupported Equipment Used: Rolling walker Ambulation Related to ADLs: Pt ambulated out of room down the hall (see PT Toniann Fail treatment for distance). Pt ambulated with mod v/c for sequence and safety @ min (A). Pt required frequent restbreaks during ambulation. ADL Comments: Pt completed bed mobility with decreased (A) today and motivated to get up. Pt with nystagmus and Lt eye nasal rotation with supine to sit. Pt describes changes in perpherial visual field changes. Pt states "i know you are there but you arent" OT to monitor visual changes closely. Mobility  Bed Mobility Bed Mobility: Yes Rolling Right: 5: Supervision;With rail;Other (comment) (HOB flate) Supine to Sit: 5: Supervision;HOB flat;With rails Sitting - Scoot to Edge of Bed: 5: Supervision;With rail Sitting - Scoot to Edge of Bed Details (indicate cue type and reason): Pt using bil Ue and BIL LE to come to sitting EOB Transfers Transfers: Yes Sit to Stand: 4: Min assist;With upper extremity assist;From bed Sit to Stand Details (indicate cue type and reason): Pt with Min V/c for hand placement. Pt with pain at pelvis with standing Stand to Sit: 4: Min assist;With upper extremity assist;To chair/3-in-1;With armrests Exercises    End of Session OT - End of Session Equipment Utilized During Treatment: Gait belt;Cervical collar;Back brace Activity Tolerance: Patient tolerated treatment well Patient left: in chair;with call bell in reach;with family/visitor present (girlfriend) Nurse Communication: Mobility status for transfers General Behavior During Session: [facility] for tasks  performed Cognition: [facility] for tasks performed  Lucile Shutters  02/21/2011, 1:32 PM Pager: [phone]

## 2011-02-21 NOTE — Progress Notes (Signed)
Clinical Social Worker continuing to follow patient for emotional support and discharge planning needs.  Patient states "I just feel like crap today," however patient did ambulate with PT in the hall while making the statement.  Patient remains with no appetite.  Per PA, plan is for patient to transfer to [facility] once he is able to ambulate adequately to transfer to the Cardiology service.    No SBIRT completed at this time due to patient working with therapy - CSW to complete prior to discharge.  Patient father called and left CSW a message over the weekend.  Clinical Social Worker attempted to return phone call with patient permission and left message at 14:04 on 02/18 on patient father cell phone.    Clinical Social Worker continues to be available for support as needed.  [address] [place], [place] [phone]

## 2011-02-21 NOTE — Progress Notes (Signed)
Needs to ambulate prior to placement.  Doing better.
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
## 2011-02-22 ENCOUNTER — Inpatient Hospital Stay (HOSPITAL_COMMUNITY): Payer: Medicaid Other

## 2011-02-22 LAB — CBC
MCH: 29.9 pg (ref 26.0–34.0)
MCHC: 31.9 g/dL (ref 30.0–36.0)
Platelets: 453 10*3/uL — ABNORMAL HIGH (ref 150–400)
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
## 2011-02-22 NOTE — Progress Notes (Signed)
Agree with above. Increase bowel regimen. Transfer to [facility] when able for VSD eval and +/- repair.

## 2011-02-22 NOTE — Progress Notes (Signed)
Orthopedic Tech Progress Note Patient Details:  Neil Sanders 1990-08-20 [id number]  Other Ortho Devices Ortho Device Location: left wrist Ortho Device Interventions: Application   Cammer, Mickie Bail 02/22/2011, 1:36 PM

## 2011-02-22 NOTE — Progress Notes (Signed)
UR of chart completed.   COBRA form for acute to acute transfer started and awaiting definite transfer date to complete and have MD sign. [facility] was notified on Friday 02/18/2011 of pending transfer.
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
## 2011-02-22 NOTE — Progress Notes (Signed)
Occupational Therapy Treatment Patient Details Name: Neil Sanders MRN: [id number] DOB: 1990-06-22 Today's Date: 02/22/2011  OT Assessment/Plan OT Assessment/Plan Comments on Treatment Session: Pt premedicated for pain and nausea. Pt still without bowel movement x 2 weeks. Pt agreeable to additional methods of intervention s/p session. RN Candise Bowens informed and providing at end of session. OT Plan: Discharge plan remains appropriate OT Frequency: Min 2X/week Recommendations for Other Services: Rehab consult Follow Up Recommendations: Inpatient Rehab Equipment Recommended: Defer to next venue OT Goals Acute Rehab OT Goals OT Goal Formulation: With patient Time For Goal Achievement: 2 weeks ADL Goals Pt Will Perform Grooming: with supervision;Sitting, edge of bed;Unsupported;Other (comment) ADL Goal: Grooming - Progress: Progressing toward goals Pt Will Perform Upper Body Bathing: with supervision;Sitting, chair;Sitting, edge of bed;Unsupported;Other (comment) ADL Goal: Upper Body Bathing - Progress: Progressing toward goals Pt Will Perform Lower Body Bathing: with supervision;Sitting, edge of bed;Other (comment) ADL Goal: Lower Body Bathing - Progress: Not met Pt Will Transfer to Toilet: with min assist;with DME;Squat pivot transfer;3-in-1 ADL Goal: Toilet Transfer - Progress: Progressing toward goals  OT Treatment Precautions/Restrictions  Precautions Precautions: Fall Precaution Comments: cervical precautions Required Braces or Orthoses: Yes Cervical Brace: Hard collar Restrictions Weight Bearing Restrictions: No Other Position/Activity Restrictions: Pt should limit UE weight bearing for cervical precautions but is WBAT   ADL ADL Grooming: Performed;Teeth care;Minimal assistance Grooming Details (indicate cue type and reason): pt educated on using container and bringing to mouth to prevent bending.  Where Assessed - Grooming: Standing at sink Equipment Used: Rolling  walker Ambulation Related to ADLs: Pt ambulating to sink level and requesting to return to bed due to nausea. Pt limited this session by nausea. Pt premediate for nausea and PA Casimiro Needle called and informed of pt without bowel movement x2 weeks with nauseate symptoms.  ADL Comments: Pt agreeable to additional medication to (A) with bowel void at end of session. Pt required extended time due to nausea. Mobility  Bed Mobility Bed Mobility: Yes Rolling Right: 5: Supervision;With rail;Other (comment) Rolling Right Details (indicate cue type and reason): Supervision level Supine to Sit: 5: Supervision;HOB elevated (Comment degrees);With rails (HOB 45 degrees) Supine to Sit Details (indicate cue type and reason): Pt performed supine to long sit using rail. Pt reports nausea in sitting position. Sitting - Scoot to Edge of Bed: 5: Supervision;With rail Sitting - Scoot to Edge of Bed Details (indicate cue type and reason): supervision for safety due to dizziness and nausea. Pt uses bilateral UE and LE to scoot EOB Sit to Supine: 4: Min assist;With rail;HOB elevated (comment degrees) Sit to Supine - Details (indicate cue type and reason): (A) with LE into bed with max cues for UE and trunk placement.  Transfers Sit to Stand: 3: Mod assist;From bed;With armrests Sit to Stand Details (indicate cue type and reason): Pt took 3 attempts to stand and need mod (A) to complete transfer. Max cues for hand and LE placement. Stand to Sit: To bed;4: Min assist;To elevated surface Stand to Sit Details: Minguard for safety with cues for hand placement Exercises    End of Session OT - End of Session Equipment Utilized During Treatment: Gait belt;Cervical collar;Back brace Activity Tolerance: Other (comment) (limited by nausea and c/o stomach discomfort) Patient left: in bed;with call bell in reach;with family/visitor present (dad) Nurse Communication: Mobility status for transfers General Behavior During Session:  Flat affect (pt with facial expression of discomfort entire session) Cognition: WFL for tasks performed  Lucile Shutters  02/22/2011, 1:42 PM  Pager: [phone]
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
## 2011-02-22 NOTE — Plan of Care (Signed)
Problem: Phase III Progression Outcomes Goal: Activity at appropriate level-compared to baseline (UP IN CHAIR FOR HEMODIALYSIS)  Outcome: Progressing Pt ambulating this session however limited by nausea/ abdominal discomfort due to lack of bowel movement x2 weeks

## 2011-02-22 NOTE — Progress Notes (Signed)
Patient ID: Neil Sanders, male   DOB: 09/01/1990, 21 y.o.   MRN: [id number] Subjective:  The patient is alert and pleasant. He looks well. He has no complaints.  Objective: Vital signs in last 24 hours: Temp:  [96.5 F (35.8 C)-98.3 F (36.8 C)] 98.3 F (36.8 C) (02/18 2200) Pulse Rate:  [52-54] 54  (02/19 0600) Resp:  [16-18] 18  (02/19 0600) BP: (116-137)/(71-86) 119/74 mmHg (02/19 0600) SpO2:  [96 %-98 %] 96 % (02/19 0600)  Intake/Output from previous day: 02/18 0701 - 02/19 0700 In: 440 [P.O.:440] Out: 550 [Urine:550] Intake/Output this shift:    Physical exam the patient is alert and oriented. He is moving all 4 extremities well. His incision is healing well.  Lab Results:  Basename 02/22/11 0650 02/21/11 0505  WBC 13.3* 14.3*  HGB 12.0* 11.7*  HCT 37.6* 36.5*  PLT 453* 425*   BMET  Basename 02/20/11 0640  NA 138  K 4.0  CL 101  CO2 27  GLUCOSE 95  BUN 11  CREATININE 0.65  CALCIUM 9.5    Studies/Results: No results found.  Assessment/Plan: Odontoid fracture: The patient is doing well clinically. From my point of view the patient can be discharged to followup with me in the office in a few weeks. I've discussed this with the patient and his father.  LOS: 15 days     Karstyn Birkey D 02/22/2011, 8:13 AM

## 2011-02-23 DIAGNOSIS — S12100A Unspecified displaced fracture of second cervical vertebra, initial encounter for closed fracture: Secondary | ICD-10-CM

## 2011-02-23 DIAGNOSIS — S329XXA Fracture of unspecified parts of lumbosacral spine and pelvis, initial encounter for closed fracture: Secondary | ICD-10-CM

## 2011-02-23 MED ORDER — PEG 3350-KCL-NA BICARB-NACL 420 G PO SOLR
4000.0000 mL | Freq: Once | ORAL | Status: AC
  Administered 2011-02-23: 4000 mL via ORAL
  Filled 2011-02-23: qty 4000

## 2011-02-23 NOTE — Progress Notes (Addendum)
Pt. Walked with PT. PT mentioned HR dropped to 20's. Pt. Was asymptomatic. RN rechecked HR manually.  Pt. HR currently at 58.  On monitor Pt. At 55-56. MD made aware,  Will continue to monitor. Currently under no s/s distress
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
## 2011-02-23 NOTE — Progress Notes (Signed)
Occupational Therapy Treatment Patient Details Name: Neil Sanders MRN: [id number] DOB: 04/04/1990 Today's Date: 02/23/2011  OT Assessment/Plan OT Assessment/Plan Comments on Treatment Session: pt with increased activity tolerance this session and decreased nausea. Pt with vtach x2 runs during session.  OT Plan: Discharge plan remains appropriate OT Frequency: Min 2X/week Recommendations for Other Services: Rehab consult Follow Up Recommendations: Inpatient Rehab Equipment Recommended: Defer to next venue OT Goals Acute Rehab OT Goals OT Goal Formulation: With patient Time For Goal Achievement: 2 weeks ADL Goals Pt Will Perform Grooming: with supervision;Sitting, edge of bed;Unsupported;Other (comment) ADL Goal: Grooming - Progress: Progressing toward goals Pt Will Perform Upper Body Bathing: with supervision;Sitting, chair;Sitting, edge of bed;Unsupported;Other (comment) ADL Goal: Upper Body Bathing - Progress: Not met Pt Will Perform Lower Body Bathing: with supervision;Sitting, edge of bed;Other (comment) ADL Goal: Lower Body Bathing - Progress: Not met Pt Will Transfer to Toilet: with min assist;with DME;Squat pivot transfer;3-in-1 ADL Goal: Toilet Transfer - Progress: Met  OT Treatment Precautions/Restrictions  Precautions Precautions: Fall Precaution Comments: cervical precautions Required Braces or Orthoses: Yes Cervical Brace: Hard collar Restrictions Weight Bearing Restrictions: No Other Position/Activity Restrictions: Pt should limit UE weight bearing for cervical precautions but is WBAT   ADL ADL Grooming: Performed;Wash/dry hands;Supervision/safety Grooming Details (indicate cue type and reason): pt using hand santizer on the wall. Pt able to reach outside base of support and release RW with bil UE Where Assessed - Grooming: Standing at sink Toilet Transfer: Research scientist (life sciences) Details (indicate cue type and reason): pt completing  toilet transfer with no void of bowel or bladder. Pt reports passing gas only. Toilet Transfer Method: Proofreader: Raised toilet seat with arms (or 3-in-1 over toilet) Toileting - Clothing Manipulation: Performed;Supervision/safety Where Assessed - Toileting Clothing Manipulation: Sit to stand from 3-in-1 or toilet Toileting - Hygiene: Not assessed Equipment Used: Rolling walker Ambulation Related to ADLs: Pt ambulating >200 ft [employer] (A). Pt required frequent rest breaks and c/o pelvic pain.  ADL Comments: Pt much more alert and ready to mobility today. No c/o nausea during session. Pt not medicated for nausea prior to admission. Pt states " I feel better today" Pt with two runs of  vtach and HR dropped to 29 for ~4-6 beats. Pt with HR 53-61 during session. Pt with no symptoms reported with decreased HR. Mobility  Bed Mobility Bed Mobility: Yes Rolling Right: 5: Supervision;With rail;Other (comment) (HOB 33) Supine to Sit: 5: Supervision;HOB elevated (Comment degrees);With rails (HOB 33) Supine to Sit Details (indicate cue type and reason): Pt performed supine to long sit using rail Sitting - Scoot to Edge of Bed: 5: Supervision;With rail Sitting - Scoot to Edge of Bed Details (indicate cue type and reason): supervision for safety due to dizziness and nausea. Pt uses bilateral UE and LE to scoot EOB Transfers Transfers: Yes Sit to Stand: 4: Min assist;From bed;From toilet Sit to Stand Details (indicate cue type and reason): (A) to initiate transfer with cues for hand placement. Pt needed two attempts to complete stand Stand to Sit: To chair/3-in-1;4: Min assist;With armrests Stand to Sit Details:   (A) to slowly descend to recliner with cues for hand placement. Pt tends to sit rapidly into recliner and needs max cues to slow down.  Exercises Total Joint Exercises Gluteal Sets: Strengthening;Both;10 reps General Exercises - Lower Extremity Long Arc Quad:  AROM;Both;Other reps (comment);Seated Hip Flexion/Marching: Strengthening;Left;10 reps;Seated  End of Session OT - End of Session Equipment Utilized During Treatment: Gait belt;Cervical  collar;Back brace Patient left: in chair;with call bell in reach;with family/visitor present (father) Nurse Communication: Mobility status for transfers General Behavior During Session: [facility] for tasks performed Cognition: [facility] for tasks performed  Lucile Shutters  02/23/2011, 10:53 AM Pager: [phone]

## 2011-02-23 NOTE — Progress Notes (Signed)
Patient has improved well over last 24 hrs. Will not need an inpatient acute rehabilitation stay at this time. I have alerted SW and PA. Home health is recommended at this time. Please call with any questions. Pager [phone]

## 2011-02-23 NOTE — Progress Notes (Signed)
This patient has been seen and I agree with the findings and treatment plan.  Sim Choquette O. Nash Bolls, III, MD, FACS [phone] (pager) [phone] (direct pager) Trauma Surgeon  
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
## 2011-02-24 MED ORDER — ONDANSETRON HCL 4 MG/2ML IJ SOLN: 4.0000 mg | INTRAMUSCULAR | Status: DC | PRN

## 2011-02-24 MED ORDER — OXYCODONE HCL 10 MG PO TABS: 10.0000 mg | ORAL_TABLET | ORAL | Status: AC | PRN

## 2011-02-24 MED ORDER — DSS 100 MG PO CAPS: 200.0000 mg | ORAL_CAPSULE | Freq: Two times a day (BID) | ORAL | Status: AC

## 2011-02-24 MED ORDER — PANTOPRAZOLE SODIUM 40 MG PO TBEC: 40.0000 mg | DELAYED_RELEASE_TABLET | Freq: Two times a day (BID) | ORAL | Status: DC

## 2011-02-24 MED ORDER — METHOCARBAMOL 500 MG PO TABS: 500.0000 mg | ORAL_TABLET | Freq: Four times a day (QID) | ORAL | Status: AC | PRN

## 2011-02-24 MED ORDER — TRAMADOL HCL 50 MG PO TABS: 100.0000 mg | ORAL_TABLET | Freq: Four times a day (QID) | ORAL | Status: AC

## 2011-02-24 MED ORDER — BISACODYL 5 MG PO TBEC: 10.0000 mg | DELAYED_RELEASE_TABLET | Freq: Every day | ORAL | Status: DC

## 2011-02-24 MED ORDER — POLYETHYLENE GLYCOL 3350 17 G PO PACK: 17.0000 g | PACK | Freq: Every day | ORAL | Status: AC

## 2011-02-24 MED ORDER — HYDROMORPHONE HCL PF 1 MG/ML IJ SOLN: 1.0000 mg | INTRAMUSCULAR | Status: DC | PRN
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
## 2011-02-24 NOTE — Progress Notes (Signed)
Pt d/c to [facility] by [facility]. Pt complained of nausea and pain. Pt. Medicated. Pt under no other s/s distress. IV still intact for carelink precaution.  Family will pick up belongings from [facility]. Labtop, phone, and chargers sent with pt.

## 2011-02-24 NOTE — Progress Notes (Signed)
Occupational Therapy Treatment Patient Details Name: [name] MRN: [id number] DOB: October 23, 1990 Today's Date: 02/24/2011  OT Assessment/Plan OT Assessment/Plan Comments on Treatment Session: Pt with increased activity tolerance and decreased abdominal discomfort s/p x 3 bowel movements 02/23/11. Pt progressing with therapy at this time.  OT Plan: Discharge plan remains appropriate;Other (comment) Recommendations for Other Services: Other (comment) (plans to d/c to [facility] for heart surg) Equipment Recommended: Defer to next venue OT Goals Acute Rehab OT Goals OT Goal Formulation: With patient Time For Goal Achievement: 2 weeks ADL Goals Pt Will Perform Grooming: with supervision;Sitting, edge of bed;Unsupported;Other (comment) ADL Goal: Grooming - Progress: Met Pt Will Perform Upper Body Bathing: with supervision;Sitting, chair;Sitting, edge of bed;Unsupported;Other (comment) ADL Goal: Upper Body Bathing - Progress: Progressing toward goals Pt Will Perform Lower Body Bathing: with supervision;Sitting, edge of bed;Other (comment) ADL Goal: Lower Body Bathing - Progress: Progressing toward goals Pt Will Transfer to Toilet: with min assist;with DME;Squat pivot transfer;3-in-1 ADL Goal: Toilet Transfer - Progress: Progressing toward goals Miscellaneous OT Goals Miscellaneous OT Goal #1: Pt will perform bed mobility with supervision while maintain cervical precautions in prep for EOB ADLs. OT Goal: Miscellaneous Goal #1 - Progress: Met  OT Treatment Precautions/Restrictions  Precautions Precautions: Fall Precaution Comments: cervical precautions Required Braces or Orthoses: Yes Cervical Brace: Hard collar Restrictions Weight Bearing Restrictions: No Other Position/Activity Restrictions: Pt should limit UE weight bearing for cervical precautions but is WBAT   ADL ADL Eating/Feeding: Performed;Set up Where Assessed - Eating/Feeding: Chair Grooming: Performed;Teeth  care;Supervision/safety Where Assessed - Grooming: Standing at sink Equipment Used: Rolling walker Ambulation Related to ADLs: Pt ambulating > 200 feet with frequent rest breaks. Pt with HR 63 during session. Pt educated on pursed lip breathing with ambulation.  ADL Comments: Pt c/o neck and hip pain on arrival. pt agreeable to OOB despite pain. pt with increased bed mobility during this session. Pt activating with core and less UE use. Pt much more confident with ambuation today. Mobility  Bed Mobility Bed Mobility: Yes Rolling Right: 6: Modified independent (Device/Increase time) Rolling Right Details (indicate cue type and reason):   Extra time to perform supine to long sit.  Transfers Transfers: Yes Sit to Stand: 4: Min assist;From bed (Min guard for safety) Sit to Stand Details (indicate cue type and reason): Minguard for safety wtih cues for hand placement. Pt tends to keep hands on RW during transfers. Stand to Sit: 4: Min assist;With armrests;To chair/3-in-1 Stand to Sit Details: Minguard for safety with cues for hand placement Exercises    End of Session OT - End of Session Equipment Utilized During Treatment: Gait belt;Cervical collar;Back brace Patient left: in chair;with call bell in reach Nurse Communication: Mobility status for transfers;Mobility status for ambulation General Behavior During Session: [facility] for tasks performed Cognition: [facility] for tasks performed  Lucile Shutters  02/24/2011, 3:45 PM Pager: [phone]

## 2011-02-24 NOTE — Progress Notes (Signed)
Report given to Laurey Arrow at [facility]. [address]

## 2011-02-24 NOTE — Discharge Summary (Signed)
Kelen Laura, MD, MPH, FACS Pager: [phone]  

## 2011-02-24 NOTE — Progress Notes (Signed)
Seen and agree Await insurance approval and bed availability for transfer to Prentice Docker, MD, MPH, FACS Pager: [phone]

## 2011-02-25 ENCOUNTER — Encounter (INDEPENDENT_AMBULATORY_CARE_PROVIDER_SITE_OTHER): Payer: Self-pay | Admitting: Internal Medicine

## 2011-02-25 NOTE — Progress Notes (Signed)
Clinical Social Worker was following for patient and family emotional support.  Patient transferred to [facility] [phone]) to the Cardiology service.  Per notes, patient family to pick up patient belongings.  CSW notified financial counselor of patient father concerns - financial counselor to follow up even after patient discharge.  Clinical Social Worker will sign off for now as social work intervention is no longer needed. Please consult [place] again if new need arises.  [address] [place], [place] [phone]

## 2011-03-01 HISTORY — PX: PACEMAKER INSERTION: SHX728

## 2011-03-01 HISTORY — PX: VSD REPAIR: SHX276

## 2011-03-16 ENCOUNTER — Telehealth: Payer: Self-pay | Admitting: Orthopedic Surgery

## 2011-03-16 NOTE — Telephone Encounter (Signed)
Patient's mom called with a request for a hospital bed. Was discharged from [facility] s/p heart surgery/pacemaker implantation. Was having pain in neck and pelvis. Dr. York Ram called in some pain medicine but said she needed to follow up with the provider taking care of his pelvis for the hospital bed. I gave her Dr. Shelba Flake office phone number so she could request that and told her that Neil Sanders needed a follow-up appointment with Dr. Dion Saucier regardless. She will call back if she has any questions.

## 2011-03-16 NOTE — Telephone Encounter (Signed)
Left message

## 2011-03-23 ENCOUNTER — Encounter: Payer: Self-pay | Admitting: Internal Medicine

## 2011-03-23 ENCOUNTER — Ambulatory Visit (INDEPENDENT_AMBULATORY_CARE_PROVIDER_SITE_OTHER): Payer: No Typology Code available for payment source | Admitting: Internal Medicine

## 2011-03-23 VITALS — BP 108/75 | HR 106 | Ht 70.0 in | Wt 230.0 lb

## 2011-03-23 DIAGNOSIS — I442 Atrioventricular block, complete: Secondary | ICD-10-CM

## 2011-03-23 DIAGNOSIS — Q210 Ventricular septal defect: Secondary | ICD-10-CM

## 2011-03-23 NOTE — Assessment & Plan Note (Signed)
Repaired He should continue to follow with the congenital clinic at [facility]. He could potentially see Octavio Manns in the [place] office eventually.  I will defer this decision to the [facility] team.

## 2011-03-23 NOTE — Assessment & Plan Note (Signed)
Normal pacemaker function See Arita Miss Art report No changes today  He will return to see me in 3 months
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
## 2011-03-23 NOTE — Patient Instructions (Signed)
Follow up in 3 months. Your physician recommends that you continue on your current medications as directed. Please refer to the Current Medication list given to you today. 

## 2011-05-06 ENCOUNTER — Telehealth: Payer: Self-pay | Admitting: *Deleted

## 2011-05-06 LAB — PACEMAKER DEVICE OBSERVATION
AL AMPLITUDE: 5.6 mv
BATTERY VOLTAGE: 2.79 V
RV LEAD THRESHOLD: 1.5 V

## 2011-05-06 NOTE — Telephone Encounter (Signed)
Spoke w/mother. Pt feels as heart is beating out of chest. appt was offered for pt to come in for pacer check in [place] office today. Pt declined because has another doctors appointment. Pt's mother wanted to know when next time in [place] and that will be 05-13-11. Mother wanted appointment for 05-13-11. Mother to call on Monday if symptoms worsen and make appointment for [place] office.

## 2011-05-06 NOTE — Telephone Encounter (Signed)
Pt's mother left message on voicemail at [phone] asking for a return call by 1230. She states in message that pt is having problem with heart. He feels like it is beating out of his chest and has been weak the last few days.   Pt gave verbal permission to speak with his mother. She states Wed night he felt like his heart was beating out of his chest. He has felt this one other time since then. He has also felt weak. She wants to know if this is normal.

## 2011-05-13 ENCOUNTER — Ambulatory Visit (INDEPENDENT_AMBULATORY_CARE_PROVIDER_SITE_OTHER): Payer: Medicaid Other | Admitting: *Deleted

## 2011-05-13 ENCOUNTER — Encounter: Payer: Self-pay | Admitting: Internal Medicine

## 2011-05-13 DIAGNOSIS — I442 Atrioventricular block, complete: Secondary | ICD-10-CM

## 2011-05-13 LAB — PACEMAKER DEVICE OBSERVATION
ATRIAL PACING PM: 0
BAMS-0001: 200 {beats}/min
BATTERY VOLTAGE: 2.78 V
BRDY-0003RV: 190 {beats}/min
BRDY-0004RV: 180 {beats}/min

## 2011-05-13 NOTE — Progress Notes (Signed)
Pacer check in clinic/heart beating harder x 1 week.

## 2011-05-18 ENCOUNTER — Telehealth: Payer: Self-pay | Admitting: Internal Medicine

## 2011-05-18 NOTE — Telephone Encounter (Signed)
Pt's mom calling re forgot to ask you a question when she talked with you last

## 2011-05-18 NOTE — Telephone Encounter (Signed)
Spoke w/ pt and answered questions.

## 2011-05-25 ENCOUNTER — Telehealth: Payer: Self-pay | Admitting: Internal Medicine

## 2011-05-25 NOTE — Telephone Encounter (Signed)
Spoke w/pt about being nauseated. Per pt has had nausea x several months. No other news issues. Mother left message.

## 2011-05-25 NOTE — Telephone Encounter (Signed)
Patient has been nauseated almost a month now and scared that its his heart

## 2011-06-03 ENCOUNTER — Telehealth: Payer: Self-pay | Admitting: Internal Medicine

## 2011-06-03 NOTE — Telephone Encounter (Signed)
Spoke with patient's mom and have asked that they follow up with Dr Clelia Croft his PCP in [place].  I don't think restless sleep is related to his heart.  She is going to call his PCP and keep follow up as scheduled with Dr Johney Frame

## 2011-06-03 NOTE — Telephone Encounter (Signed)
Please return call to patient mother Gervase Colberg [phone] regarding patient's restless sleep and extreme fatigue.

## 2011-07-21 ENCOUNTER — Encounter: Payer: Medicaid Other | Admitting: Internal Medicine

## 2011-09-12 ENCOUNTER — Other Ambulatory Visit: Payer: Self-pay | Admitting: Neurosurgery

## 2011-09-12 DIAGNOSIS — M542 Cervicalgia: Secondary | ICD-10-CM

## 2011-09-16 ENCOUNTER — Ambulatory Visit
Admission: RE | Admit: 2011-09-16 | Discharge: 2011-09-16 | Disposition: A | Discharge status: Home / Self Care | Payer: Medicaid Other | Source: Ambulatory Visit | Attending: Neurosurgery | Admitting: Neurosurgery

## 2011-09-16 DIAGNOSIS — M542 Cervicalgia: Secondary | ICD-10-CM

## 2011-11-28 ENCOUNTER — Other Ambulatory Visit: Payer: Self-pay | Admitting: Neurosurgery

## 2012-01-02 ENCOUNTER — Other Ambulatory Visit: Payer: Self-pay | Admitting: Neurosurgery

## 2012-01-24 ENCOUNTER — Other Ambulatory Visit: Payer: Self-pay | Admitting: Neurosurgery

## 2012-02-21 ENCOUNTER — Other Ambulatory Visit: Payer: Self-pay | Admitting: Neurosurgery

## 2012-03-07 ENCOUNTER — Encounter: Payer: Self-pay | Admitting: *Deleted

## 2012-04-26 ENCOUNTER — Other Ambulatory Visit: Payer: Self-pay | Admitting: Neurosurgery

## 2012-04-26 DIAGNOSIS — M542 Cervicalgia: Secondary | ICD-10-CM

## 2012-07-09 ENCOUNTER — Other Ambulatory Visit: Payer: Self-pay | Admitting: Neurosurgery

## 2012-07-09 DIAGNOSIS — M542 Cervicalgia: Secondary | ICD-10-CM

## 2012-07-17 ENCOUNTER — Other Ambulatory Visit: Payer: Medicaid Other

## 2012-07-17 ENCOUNTER — Inpatient Hospital Stay
Admission: RE | Admit: 2012-07-17 | Discharge: 2012-07-17 | Disposition: A | Discharge status: Home / Self Care | Payer: Medicaid Other | Source: Ambulatory Visit | Attending: Neurosurgery | Admitting: Neurosurgery

## 2012-07-23 ENCOUNTER — Ambulatory Visit
Admission: RE | Admit: 2012-07-23 | Discharge: 2012-07-23 | Disposition: A | Discharge status: Home / Self Care | Payer: Medicaid Other | Source: Ambulatory Visit | Attending: Neurosurgery | Admitting: Neurosurgery

## 2012-07-23 VITALS — BP 109/61 | HR 53

## 2012-07-23 DIAGNOSIS — M542 Cervicalgia: Secondary | ICD-10-CM

## 2012-07-23 MED ORDER — IOHEXOL 300 MG/ML  SOLN
10.0000 mL | Freq: Once | INTRAMUSCULAR | Status: AC | PRN
  Administered 2012-07-23: 10 mL via INTRATHECAL

## 2012-07-23 MED ORDER — ONDANSETRON HCL 4 MG/2ML IJ SOLN
4.0000 mg | Freq: Once | INTRAMUSCULAR | Status: AC
  Administered 2012-07-23: 4 mg via INTRAMUSCULAR

## 2012-07-23 MED ORDER — DIAZEPAM 5 MG PO TABS
10.0000 mg | ORAL_TABLET | Freq: Once | ORAL | Status: AC
  Administered 2012-07-23: 10 mg via ORAL

## 2012-07-23 MED ORDER — MEPERIDINE HCL 100 MG/ML IJ SOLN
75.0000 mg | Freq: Once | INTRAMUSCULAR | Status: AC
  Administered 2012-07-23: 75 mg via INTRAMUSCULAR

## 2013-08-05 ENCOUNTER — Telehealth: Payer: Self-pay | Admitting: Cardiology

## 2013-08-06 NOTE — Telephone Encounter (Signed)
Closed enounter °

## 2013-09-20 ENCOUNTER — Encounter: Payer: Self-pay | Admitting: Internal Medicine

## 2013-09-20 ENCOUNTER — Telehealth: Payer: Self-pay | Admitting: Internal Medicine

## 2013-09-20 NOTE — Telephone Encounter (Signed)
09-20-13 Sent past due letter, needs pacer ck with [facility], last ck 05-13-11/mt

## 2014-01-08 ENCOUNTER — Encounter (HOSPITAL_COMMUNITY): Payer: Self-pay | Admitting: Family Medicine

## 2014-01-08 ENCOUNTER — Emergency Department (HOSPITAL_COMMUNITY)
Admission: EM | Admit: 2014-01-08 | Discharge: 2014-01-08 | Disposition: A | Discharge status: Home / Self Care | Payer: BLUE CROSS/BLUE SHIELD | Attending: Emergency Medicine | Admitting: Emergency Medicine

## 2014-01-08 DIAGNOSIS — Z79899 Other long term (current) drug therapy: Secondary | ICD-10-CM | POA: Diagnosis not present

## 2014-01-08 DIAGNOSIS — K219 Gastro-esophageal reflux disease without esophagitis: Secondary | ICD-10-CM | POA: Insufficient documentation

## 2014-01-08 DIAGNOSIS — F1120 Opioid dependence, uncomplicated: Secondary | ICD-10-CM | POA: Diagnosis present

## 2014-01-08 DIAGNOSIS — Z950 Presence of cardiac pacemaker: Secondary | ICD-10-CM | POA: Diagnosis not present

## 2014-01-08 DIAGNOSIS — I272 Other secondary pulmonary hypertension: Secondary | ICD-10-CM | POA: Diagnosis not present

## 2014-01-08 DIAGNOSIS — Z8774 Personal history of (corrected) congenital malformations of heart and circulatory system: Secondary | ICD-10-CM | POA: Diagnosis not present

## 2014-01-08 DIAGNOSIS — F419 Anxiety disorder, unspecified: Secondary | ICD-10-CM | POA: Insufficient documentation

## 2014-01-08 DIAGNOSIS — R110 Nausea: Secondary | ICD-10-CM | POA: Diagnosis not present

## 2014-01-08 DIAGNOSIS — I442 Atrioventricular block, complete: Secondary | ICD-10-CM | POA: Insufficient documentation

## 2014-01-08 DIAGNOSIS — F1910 Other psychoactive substance abuse, uncomplicated: Secondary | ICD-10-CM

## 2014-01-08 DIAGNOSIS — J45909 Unspecified asthma, uncomplicated: Secondary | ICD-10-CM | POA: Insufficient documentation

## 2014-01-08 DIAGNOSIS — Z87891 Personal history of nicotine dependence: Secondary | ICD-10-CM | POA: Diagnosis not present

## 2014-01-08 MED ORDER — ONDANSETRON 4 MG PO TBDP: ORAL_TABLET | ORAL | Status: DC

## 2014-01-08 MED ORDER — IBUPROFEN 800 MG PO TABS: 800.0000 mg | ORAL_TABLET | Freq: Three times a day (TID) | ORAL | Status: DC

## 2014-01-08 NOTE — ED Notes (Signed)
Pt here from detox from opiates. sts last use yesterday. sts he has been under a lot of stress lately. Denies SI/HI. Pt has pacemaker.
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
## 2014-01-08 NOTE — Discharge Instructions (Signed)
Take zofran as directed as needed for nausea. Take ibuprofen for pain. Follow up with one of the resources provided for detox.  Chemical Dependency Chemical dependency is an addiction to drugs or alcohol. It is characterized by the repeated behavior of seeking out and using drugs and alcohol despite harmful consequences to the health and safety of ones self and others.  RISK FACTORS There are certain situations or behaviors that increase a person's risk for chemical dependency. These include:  A family history of chemical dependency.  A history of mental health issues, including depression and anxiety.  A home environment where drugs and alcohol are easily available to you.  Drug or alcohol use at a young age. SYMPTOMS  The following symptoms can indicate chemical dependency:  Inability to limit the use of drugs or alcohol.  Nausea, sweating, shakiness, and anxiety that occurs when alcohol or drugs are not being used.  An increase in amount of drugs or alcohol that is necessary to get drunk or high. People who experience these symptoms can assess their use of drugs and alcohol by asking themselves the following questions:  Have you been told by friends or family that they are worried about your use of alcohol or drugs?  Do friends and family ever tell you about things you did while drinking alcohol or using drugs that you do not remember?  Do you lie about using alcohol or drugs or about the amounts you use?  Do you have difficulty completing daily tasks unless you use alcohol or drugs?  Is the level of your work or school performance lower because of your drug or alcohol use?  Do you get sick from using drugs or alcohol but keep using anyway?  Do you feel uncomfortable in social situations unless you use alcohol or drugs?  Do you use drugs or alcohol to help forget problems? An answer of yes to any of these questions may indicate chemical dependency. Professional evaluation  is suggested. Document Released: 12/14/2000 Document Revised: 03/14/2011 Document Reviewed: 02/25/2010 [facility] Patient Information 2015 [place], [place]. This information is not intended to replace advice given to you by your health care provider. Make sure you discuss any questions you have with your health care provider.  Opioid Use Disorder Opioid use disorder is a mental disorder. It is the continued nonmedical use of opioids in spite of risks to health and well-being. Misused opioids include the street drug heroin. They also include pain medicines such as morphine, hydrocodone, oxycodone, and fentanyl. Opioids are very addictive. People who misuse opioids get an exaggerated feeling of well-being. Opioid use disorder often disrupts activities at home, work, or school. It may cause mental or physical problems.  A family history of opioid use disorder puts you at higher risk of it. People with opioid use disorder often misuse other drugs or have mental illness such as depression, posttraumatic stress disorder, or antisocial personality disorder. They also are at risk of suicide and death from overdose. SIGNS AND SYMPTOMS  Signs and symptoms of opioid use disorder include:  Use of opioids in larger amounts or over a longer period than intended.  Unsuccessful attempts to cut down or control opioid use.  A lot of time spent obtaining, using, or recovering from the effects of opioids.  A strong desire or urge to use opioids (craving).  Continued use of opioids in spite of major problems at work, school, or home because of use.  Continued use of opioids in spite of relationship problems because  of use.  Giving up or cutting down on important life activities because of opioid use.  Use of opioids over and over in situations when it is physically hazardous, such as driving a car.  Continued use of opioids in spite of a physical problem that is likely related to use. Physical problems can  include:  Severe constipation.  Poor nutrition.  Infertility.  Tuberculosis.  Aspiration pneumonia.  Infections such as human immunodeficiency virus (HIV) and hepatitis (from injecting opioids).  Continued use of opioids in spite of a mental problem that is likely related to use. Mental problems can include:  Depression.  Anxiety.  Hallucinations.  Sleep problems.  Loss of sexual function.  Need to use more and more opioids to get the same effect, or lessened effect over time with use of the same amount (tolerance).  Having withdrawal symptoms when opioid use is stopped, or using opioids to reduce or avoid withdrawal symptoms. Withdrawal symptoms include:  Depressed, anxious, or irritable mood.  Nausea, vomiting, diarrhea, or intestinal cramping.  Muscle aches or spasms.  Excessive tearing or runny nose.  Dilated pupils, sweating, or hairs standing on end.  Yawning.  Fever, raised blood pressure, or fast pulse.  Restlessness or trouble sleeping. This does not apply to people taking opioids for medical reasons only. DIAGNOSIS Opioid use disorder is diagnosed by your health care provider. You may be asked questions about your opioid use and and how it affects your life. A physical exam may be done. A drug screen may be ordered. You may be referred to a mental health professional. The diagnosis of opioid use disorder requires at least two symptoms within 12 months. The type of opioid use disorder you have depends on the number of signs and symptoms you have. The type may be:  Mild. Two or three signs and symptoms.   Moderate. Four or five signs and symptoms.   Severe. Six or more signs and symptoms. TREATMENT  Treatment is usually provided by mental health professionals with training in substance use disorders.The following options are available:  Detoxification.This is the first step in treatment for withdrawal. It is medically supervised withdrawal with the  use of medicines. These medicines lessen withdrawal symptoms. They also raise the chance of becoming opioid free.  Counseling, also known as talk therapy. Talk therapy addresses the reasons you use opioids. It also addresses ways to keep you from using again (relapse). The goals of talk therapy are to avoid relapse by:  Identifying and avoiding triggers for use.  Finding healthy ways to cope with stress.  Learning how to handle cravings.  Support groups. Support groups provide emotional support, advice, and guidance.  A medicine that blocks opioid receptors in your brain. This medicine can reduce opioid cravings that lead to relapse. This medicine also blocks the desired opioid effect when relapse occurs.  Opioids that are taken by mouth in place of the misused opioid (opioid maintenance treatment). These medicines satisfy cravings but are safer than commonly misused opioids. This often is the best option for people who continue to relapse with other treatments. HOME CARE INSTRUCTIONS   Take medicines only as directed by your health care provider.  Check with your health care provider before starting new medicines.  Keep all follow-up visits as directed by your health care provider. SEEK MEDICAL CARE IF:  You are not able to take your medicines as directed.  Your symptoms get worse. SEEK IMMEDIATE MEDICAL CARE IF:  You have serious thoughts about hurting  yourself or others.  You may have taken an overdose of opioids. FOR MORE INFORMATION  National Institute on Drug Abuse: http://www.price-smith.com/www.drugabuse.gov  Substance Abuse and Mental Health Services Administration: [employer].com.ptwww.samhsa.gov Document Released: 10/17/2006 Document Revised: 05/06/2013 Document Reviewed: 01/02/2013 [facility] Patient Information 2015 [place], [place]. This information is not intended to replace advice given to you by your health care provider. Make sure you discuss any questions you have with your health care  provider.  Narcotic Overdose A narcotic overdose is the misuse or overuse of a narcotic drug. A narcotic overdose can make you pass out and stop breathing. If you are not treated right away, this can cause permanent brain damage or stop your heart. Medicine may be given to reverse the effects of an overdose. If so, this medicine may bring on withdrawal symptoms. The symptoms may be abdominal cramps, throwing up (vomiting), sweating, chills, and nervousness. Injecting narcotics can cause more problems than just an overdose. AIDS, hepatitis, and other very serious infections are transmitted by sharing needles and syringes. If you decide to quit using, there are medicines which can help you through the withdrawal period. Trying to quit all at once on your own can be uncomfortable, but not life-threatening. Call your caregiver, Narcotics Anonymous, or any drug and alcohol treatment program for further help.  Document Released: 01/28/2004 Document Revised: 03/14/2011 Document Reviewed: 11/21/2008 [facility] Patient Information 2015 [place], [place]. This information is not intended to replace advice given to you by your health care provider. Make sure you discuss any questions you have with your health care provider.

## 2014-01-10 ENCOUNTER — Encounter (HOSPITAL_COMMUNITY): Payer: Self-pay | Admitting: Emergency Medicine

## 2014-01-10 ENCOUNTER — Emergency Department (HOSPITAL_COMMUNITY)
Admission: EM | Admit: 2014-01-10 | Discharge: 2014-01-10 | Disposition: A | Discharge status: Home / Self Care | Payer: BLUE CROSS/BLUE SHIELD | Attending: Emergency Medicine | Admitting: Emergency Medicine

## 2014-01-10 DIAGNOSIS — F1210 Cannabis abuse, uncomplicated: Secondary | ICD-10-CM | POA: Insufficient documentation

## 2014-01-10 DIAGNOSIS — Z8679 Personal history of other diseases of the circulatory system: Secondary | ICD-10-CM | POA: Insufficient documentation

## 2014-01-10 DIAGNOSIS — Z87891 Personal history of nicotine dependence: Secondary | ICD-10-CM | POA: Insufficient documentation

## 2014-01-10 DIAGNOSIS — Q210 Ventricular septal defect: Secondary | ICD-10-CM | POA: Insufficient documentation

## 2014-01-10 DIAGNOSIS — Z79899 Other long term (current) drug therapy: Secondary | ICD-10-CM | POA: Insufficient documentation

## 2014-01-10 DIAGNOSIS — F13129 Sedative, hypnotic or anxiolytic abuse with intoxication, unspecified: Secondary | ICD-10-CM | POA: Insufficient documentation

## 2014-01-10 DIAGNOSIS — J45909 Unspecified asthma, uncomplicated: Secondary | ICD-10-CM | POA: Insufficient documentation

## 2014-01-10 DIAGNOSIS — K219 Gastro-esophageal reflux disease without esophagitis: Secondary | ICD-10-CM | POA: Insufficient documentation

## 2014-01-10 DIAGNOSIS — F1410 Cocaine abuse, uncomplicated: Secondary | ICD-10-CM | POA: Insufficient documentation

## 2014-01-10 DIAGNOSIS — Z8711 Personal history of peptic ulcer disease: Secondary | ICD-10-CM | POA: Insufficient documentation

## 2014-01-10 DIAGNOSIS — F1110 Opioid abuse, uncomplicated: Secondary | ICD-10-CM | POA: Insufficient documentation

## 2014-01-10 DIAGNOSIS — F1910 Other psychoactive substance abuse, uncomplicated: Secondary | ICD-10-CM

## 2014-01-10 LAB — CBC
HCT: 47.5 % (ref 39.0–52.0)
HEMOGLOBIN: 15.7 g/dL (ref 13.0–17.0)
MCH: 30.3 pg (ref 26.0–34.0)
MCHC: 33.1 g/dL (ref 30.0–36.0)
MCV: 91.7 fL (ref 78.0–100.0)
Platelets: 237 10*3/uL (ref 150–400)
RBC: 5.18 MIL/uL (ref 4.22–5.81)
RDW: 13.2 % (ref 11.5–15.5)
WBC: 9.6 10*3/uL (ref 4.0–10.5)

## 2014-01-10 LAB — RAPID URINE DRUG SCREEN, HOSP PERFORMED
AMPHETAMINES: NOT DETECTED
Barbiturates: NOT DETECTED
Benzodiazepines: POSITIVE — AB
COCAINE: POSITIVE — AB
OPIATES: POSITIVE — AB
Tetrahydrocannabinol: POSITIVE — AB

## 2014-01-10 LAB — SALICYLATE LEVEL: Salicylate Lvl: <4 mg/dL (ref 2.8–20.0)

## 2014-01-10 LAB — URINALYSIS, ROUTINE W REFLEX MICROSCOPIC
BILIRUBIN URINE: NEGATIVE
Glucose, UA: NEGATIVE mg/dL
Hgb urine dipstick: NEGATIVE
Ketones, ur: NEGATIVE mg/dL
LEUKOCYTES UA: NEGATIVE
NITRITE: NEGATIVE
PROTEIN: NEGATIVE mg/dL
SPECIFIC GRAVITY, URINE: 1.025 (ref 1.005–1.030)
UROBILINOGEN UA: 1 mg/dL (ref 0.0–1.0)
pH: 6 (ref 5.0–8.0)

## 2014-01-10 LAB — HEPATIC FUNCTION PANEL
ALBUMIN: 4.4 g/dL (ref 3.5–5.2)
ALT: 80 U/L — ABNORMAL HIGH (ref 0–53)
AST: 58 U/L — ABNORMAL HIGH (ref 0–37)
Alkaline Phosphatase: 62 U/L (ref 39–117)
BILIRUBIN TOTAL: 0.6 mg/dL (ref 0.3–1.2)
Bilirubin, Direct: 0.1 mg/dL (ref 0.0–0.3)
Indirect Bilirubin: 0.5 mg/dL (ref 0.3–0.9)
TOTAL PROTEIN: 7.4 g/dL (ref 6.0–8.3)

## 2014-01-10 LAB — BASIC METABOLIC PANEL
ANION GAP: 6 (ref 5–15)
BUN: 6 mg/dL (ref 6–23)
CALCIUM: 9 mg/dL (ref 8.4–10.5)
CHLORIDE: 104 meq/L (ref 96–112)
CO2: 28 mmol/L (ref 19–32)
CREATININE: 0.97 mg/dL (ref 0.50–1.35)
GFR calc Af Amer: >90 mL/min (ref >90)
GFR calc non Af Amer: >90 mL/min (ref >90)
Glucose, Bld: 94 mg/dL (ref 70–99)
Potassium: 3.6 mmol/L (ref 3.5–5.1)
Sodium: 138 mmol/L (ref 135–145)

## 2014-01-10 LAB — ACETAMINOPHEN LEVEL: Acetaminophen (Tylenol), Serum: <10 ug/mL — ABNORMAL LOW (ref 10–30)

## 2014-01-10 LAB — ETHANOL: Alcohol, Ethyl (B): <5 mg/dL (ref 0–9)

## 2014-01-10 NOTE — ED Notes (Signed)
Discharge instructions reviewed with pt, questions answered. Pt verbalized understanding.  

## 2014-01-10 NOTE — ED Notes (Signed)
Pt send from [facility] for opiod detox. Pt denies si/hi at this time. Pt reports hearing voices intermittently since Sunday. Pt reports "i thought i was talking to god." pt alert and calm in triage.

## 2014-01-10 NOTE — ED Provider Notes (Signed)
CSN: [id number]     Arrival date & time 01/10/14  1044 History   This chart was scribed for Joya Gaskins, MD by Haywood Pao, ED Scribe. The patient was seen in [facility]17 and the patient's care was started at 11:54 AM.  Chief Complaint  Patient presents with  . Medical Clearance   Patient is a 24 y.o. male presenting with drug problem. The history is provided by the patient. No language interpreter was used.  Drug Problem This is a recurrent problem. The problem occurs daily. The problem has not changed since onset.Pertinent negatives include no chest pain, no abdominal pain, no headaches and no shortness of breath. Nothing aggravates the symptoms. Nothing relieves the symptoms. He has tried nothing for the symptoms. The treatment provided no relief.    HPI Comments: Neil Sanders is a 24 y.o. male who presents to the Emergency Department sent from [facility] for opoid detox and medical clearance. His last opoid usage was 5 days ago. Pt states he takes "whatever he gets his hands on". He typically snorts it and does not inject. Pt also reports using cocaine, last usage was several days ago.Pt also smokes marijuana. Pt reports feeling weak and dizzy. He states he had a "bad mental breakdown" this weekend and was hearing voices but this has subsided. Pt has a pacemaker, put in 2013. No recent issues. Last saw a cardiologist one month ago at [facility]. He denies CP, abdominal pain, HA, SOB, syncope, back pain, fever, vomiting, diarrhea and leg swelling. Pt denies homicidal or suicidal ideation.   Past Medical History  Diagnosis Date  . GERD (gastroesophageal reflux disease)   . Asthma   . PUD (peptic ulcer disease)   . Asthma   . Anxiety   . Back pain   . Knee pain   . Perimembranous ventricular septal defect     repaired at [facility] with patch 03/01/11  . Pulmonary hypertension   . Complete heart block     s/p epicardial PPM implant at [facility] 03/01/11   Past Surgical History  Procedure  Laterality Date  . Eye surgery    . Tonsillectomy    . Vsd repair  03/01/11    patch repair at [facility]  . Pacemaker insertion  03/01/11    epicardial MDT pacing system implanted at [facility] History  Problem Relation Age of Onset  . Sudden death      paternal great uncle and paternal great grandfather both died suddenly at home   History  Substance Use Topics  . Smoking status: Former Smoker -- 0.50 packs/day for 2 years    Types: Cigarettes    Quit date: 02/07/2011  . Smokeless tobacco: Never Used     Comment: 1/4 pack a day.  . Alcohol Use: No    Review of Systems  Constitutional: Negative for fever.  Respiratory: Negative for shortness of breath.   Cardiovascular: Negative for chest pain and leg swelling.  Gastrointestinal: Negative for vomiting, abdominal pain and diarrhea.  Musculoskeletal: Negative for back pain.  Neurological: Positive for dizziness and weakness. Negative for syncope and headaches.  Psychiatric/Behavioral: Negative for suicidal ideas.  All other systems reviewed and are negative.   Allergies  Sulfa antibiotics  Home Medications   Prior to Admission medications   Medication Sig Start Date End Date Taking? Authorizing Provider  albuterol (PROVENTIL HFA;VENTOLIN HFA) 108 (90 BASE) MCG/ACT inhaler Inhale 2 puffs into the lungs every 6 (six) hours as needed. For shortness of  breath    Historical Provider, MD  docusate sodium (COLACE) 100 MG capsule Take 200 mg by mouth daily.    Historical Provider, MD  fexofenadine (ALLEGRA) 180 MG tablet Take 180 mg by mouth daily.      Historical Provider, MD  gabapentin (NEURONTIN) 600 MG tablet Take 600 mg by mouth 3 (three) times daily.    Historical Provider, MD  HYDROcodone-acetaminophen (NORCO) 10-325 MG per tablet Take 1 tablet by mouth every 5 (five) hours as needed.    Historical Provider, MD  ibuprofen (ADVIL,MOTRIN) 800 MG tablet Take 1 tablet (800 mg total) by mouth 3 (three) times daily. 01/08/14   Robyn M  Hess, PA-C  methocarbamol (ROBAXIN) 500 MG tablet Take 500 mg by mouth 4 (four) times daily.    Historical Provider, MD  ondansetron (ZOFRAN ODT) 4 MG disintegrating tablet 4mg  ODT q4 hours prn nausea/vomit 01/08/14   Robyn M Hess, PA-C  pantoprazole (PROTONIX) 40 MG tablet Take 1 tablet (40 mg total) by mouth 2 (two) times daily before a meal. 02/24/11 02/24/12  Freeman CaldronMichael J Jeffery, PA-C   BP 107/63 mmHg  Pulse 67  Temp(Src) 96.6 F (35.9 C) (Temporal)  Resp 18  Ht 6' (1.829 m)  Wt 245 lb (111.131 kg)  BMI 33.22 kg/m2  SpO2 99% Physical Exam  Nursing note and vitals reviewed.  CONSTITUTIONAL: Well developed/well nourished HEAD: Normocephalic/atraumatic EYES: EOMI/PERRL ENMT: Mucous membranes moist NECK: supple no meningeal signs SPINE/BACK:entire spine nontender CV: S1/S2 noted, no murmurs/rubs/gallops noted LUNGS: Lungs are clear to auscultation bilaterally, no apparent distress ABDOMEN: soft, nontender, no rebound or guarding, bowel sounds noted throughout abdomen GU:no cva tenderness NEURO: Pt is awake/alert/appropriate, moves all extremitiesx4.  No facial droop.   EXTREMITIES: pulses normal/equal, full ROM SKIN: warm, color normal PSYCH: no abnormalities of mood noted, alert and oriented to situation  ED Course  Procedures  DIAGNOSTIC STUDIES: Oxygen Saturation is 99% on room air, normal by my interpretation.    COORDINATION OF CARE: 12:00 PM Discussed treatment plan with pt at bedside and pt agreed to plan.  12:45 [facility]: "Neil Sanders is doing well from a pacing and cardiac standpoint. His device went to high output because of a slight variability in his ventricular lead thresholds. Programming changes were made to keep his output at a set level and monitor thresholds only. 2. His echocardiogram is stable and needs repeated every 2-3 years unless there are concerns. 3. Follow-up: 1 year 4. Call-ins: Every 3 months 5. Avoid  activities that may result in trauma/damage to the pacemaker.  It appears from a cardiac standpoint he is stable (pt with h/o traumatic VSD and had repair and pacemaker placed in 2013) he denies active CP/dyspnea/LE edema He had reported previous Auditory hallucinations but now denies this, suspect it may have been substance induced He is well appearing, no distress, medically stable for discharge and will f/u with [facility] for placement  Labs Review Labs Reviewed  URINE RAPID DRUG SCREEN (HOSP PERFORMED) - Abnormal; Notable for the following:    Opiates POSITIVE (*)    Cocaine POSITIVE (*)    Benzodiazepines POSITIVE (*)    Tetrahydrocannabinol POSITIVE (*)    All other components within normal limits  ACETAMINOPHEN LEVEL - Abnormal; Notable for the following:    Acetaminophen (Tylenol), Serum <10.0 (*)    All other components within normal limits  HEPATIC FUNCTION PANEL - Abnormal; Notable for the following:    AST 58 (*)  ALT 80 (*)    All other components within normal limits  URINALYSIS, ROUTINE W REFLEX MICROSCOPIC  CBC  BASIC METABOLIC PANEL  ETHANOL  SALICYLATE LEVEL    EKG Interpretation   Date/Time:  Friday January 10 2014 11:24:32 EST Ventricular Rate:  55 PR Interval:  150 QRS Duration: 168 QT Interval:  479 QTC Calculation: 458 R Axis:   -140 Text Interpretation:  Sinus rhythm Right bundle branch block Anterolateral  infarct, old No previous ECGs available Abnormal ekg Confirmed by Bebe Shaggy   MD, Shaylie Eklund ([id number]) on 01/10/2014 11:42:15 AM      MDM   Final diagnoses:  Polysubstance abuse      Nursing notes including past medical history and social history reviewed and considered in documentation Labs/vital reviewed myself and considered during evaluation Previous records reviewed and considered   I personally performed the services described in this documentation, which was scribed in my presence. The recorded information has been reviewed and is  accurate.       Joya Gaskins, MD 01/10/14 [phone]

## 2014-01-10 NOTE — Discharge Instructions (Signed)
°Emergency Department Resource Guide °1) Find a Doctor and Pay Out of Pocket °Although you won't have to find out who is covered by your insurance plan, it is a good idea to ask around and get recommendations. You will then need to call the office and see if the doctor you have chosen will accept you as a new patient and what types of options they offer for patients who are self-pay. Some doctors offer discounts or will set up payment plans for their patients who do not have insurance, but you will need to ask so you aren't surprised when you get to your appointment. ° °2) Contact Your Local Health Department °Not all health departments have doctors that can see patients for sick visits, but many do, so it is worth a call to see if yours does. If you don't know where your local health department is, you can check in your phone book. The CDC also has a tool to help you locate your state's health department, and many state websites also have listings of all of their local health departments. ° °3) Find a Walk-in Clinic °If your illness is not likely to be very severe or complicated, you may want to try a walk in clinic. These are popping up all over the country in pharmacies, drugstores, and shopping centers. They're usually staffed by nurse practitioners or physician assistants that have been trained to treat common illnesses and complaints. They're usually fairly quick and inexpensive. However, if you have serious medical issues or chronic medical problems, these are probably not your best option. ° °No Primary Care Doctor: °- Call [facility] at  832-8000 - they can help you locate a primary care doctor that  accepts your insurance, provides certain services, etc. °- Physician Referral Service- 1-800-533-3463 ° °Chronic Pain Problems: °Organization         Address  Phone   Notes  °[facility]  (336) 297-2271 Patients need to be referred by their primary care doctor.  ° °Medication  Assistance: °Organization         Address  Phone   Notes  °[place] [facility] [address] °[place], [place] [address] (336) 641-8030 --Must be a resident of [place] °-- Must have NO insurance coverage whatsoever (no Medicaid/ Medicare, etc.) °-- The pt. MUST have a primary care doctor that directs their care regularly and follows them in the community °  °[facility]  (866) 331-1348   °[employer]  (888) 892-1162   ° °Agencies that provide inexpensive medical care: °Organization         Address  Phone   Notes  °[facility]  (336) 832-8035   °[facility]    (336) 832-7272   °[facility] [address] °[place], [place] [address] (336) 832-4777   °[facility] [address], °[place] (336) 271-4999   °[facility]    (336) 373-0678   °[facility]    (336) 272-1050   °[facility] ° [address], [place] Phone:  (336) 832-4444, Fax:  (336) 832-4440 Hours of Operation:  9 am - 6 pm, M-F.  Also accepts Medicaid/Medicare and self-pay.  °[facility] ° [address], [place] Phone: (336) 832-3150, Fax: (336) 832-3151. Hours of Operation:  8:30 am - 5:30 pm, M-F.  Also accepts Medicaid and self-pay.  °[facility] [place] [address], [place] Phone: (336) 878-6027   °[facility] [address], [place], [place] (336)723-1848, Ext. 123 Mondays & Thursdays: 7-9 AM.  First 15 patients are seen on a first come, first serve basis. °  ° °Medicaid-accepting [place] Providers: ° °Organization         Address  Phone   Notes  °[facility] [address], [place] (336) 641-2100 Also accepts self-pay patients.  °[facility] [address], [place] ° (336) 856-9996   °[facility] [address], [place]  (336) 288-8857   °[facility] [address] [place] [address], [place] (336) 299-7000   °Veita Bland [address], [place]  ° (336) 373-1557 Only accepts [place] Access Medicaid patients after they have their name applied to their card.  ° °Self-Pay (no insurance) in [place]: ° °Organization         Address  Phone   Notes  °Sickle Cell Patients, [facility] [address], [place] (336) 832-1970   °[facility] [address], [place] (336) 832-4400   °[facility] ° [address], [place] (336) 992-4800   °[facility]/Dr. Osei-Bonsu ° [address] [place] [address], [place] or [address], [place] (336) 841-8500 Phone number for both [place] and [place] locations is the same.  °[facility] [address], [place] (336) 299-0000   °[facility] [address] [place] [address], [place] or [address] (336) 852-7530 °(336) 878-2260   °[facility] [address], [place] (336) 350-1642, phone; (336) 294-5005, fax Sees patients 1st and 3rd Saturday of every month.  Must not qualify for public or private insurance (i.e. Medicaid, Medicare, [place] Health Choice, Veterans' Benefits) • Household income should be no more than 200% of the poverty level •The clinic cannot treat you if you are pregnant or think you are pregnant • Sexually transmitted diseases are not treated at the clinic.  ° ° °Dental Care: °Organization         Address  Phone  Notes  °[place] [facility] [address],  (336) 641-6152 Accepts children up to age 21 who are enrolled in Medicaid or [place] Health Choice; pregnant women with a Medicaid card; and children who have applied for Medicaid or [place] Health Choice, but were declined, whose parents can pay a reduced fee at time of service.  °[place]  [facility] [place]  [address], [place] (336) 641-7733 Accepts children up to age 21 who are enrolled in Medicaid or [place] Health Choice; pregnant women with a Medicaid card; and children who have applied for Medicaid or  Health Choice, but were declined, whose parents can pay a reduced fee at time of service.  °[facility] ° [address],  (336) 641-4533 Patients are seen by appointment only. Walk-ins are not accepted. [facility] will see patients 18 years of age and older. °Monday - Tuesday (8am-5pm) °Most Wednesdays (8:30-5pm) °$30 per visit, cash only  °[facility] ° [address], [place] (336) 641-4533 Patients are seen by appointment only. Walk-ins are not accepted. [facility] will see patients 18 years of age and older. °One   Wednesday Evening (Monthly: Volunteer Based).  $30 per visit, cash only  °[facility]  (919) 537-3737 for adults; Children under age 4, call Graduate Pediatric Dentistry at (919) [phone]. Children aged 4-14, please call (919) [phone] to request a pediatric application. ° Dental services are provided in all areas of dental care including fillings, crowns and bridges, complete and partial dentures, implants, gum treatment, root canals, and extractions. Preventive care is also provided. Treatment is provided to both adults and children. °Patients are selected via a lottery and there is often a waiting list. °  °[facility] [address], °[place] ° (336) 763-8833 [email or web address] °  °[facility] [address], [place], [place] (336)723-1848, Ext. 123 Second and Fourth Thursday of each month, opens at 6:30 AM; Clinic ends at 9 AM.  Patients are seen on a first-come first-served basis, and a limited number are seen during each clinic.  ° °[facility] ° [address], [place], [place] (336) 723-7904    Eligibility Requirements °You must have lived in [place], [place], or [place] counties for at least the last three months. °  You cannot be eligible for state or federal sponsored healthcare insurance, including Veterans Administration, Medicaid, or Medicare. °  You generally cannot be eligible for healthcare insurance through your employer.  °  How to apply: °Eligibility screenings are held every Tuesday and Wednesday afternoon from 1:00 pm until 4:00 pm. You do not need an appointment for the interview!  °[facility] [address], [place], [place] 336-631-2330   °[facility] [facility]  336-342-8273   °[facility]  336-703-3100   °[facility]  336-570-6415   ° °Behavioral Health Resources in the Community: °Intensive Outpatient Programs °Organization         Address  Phone  Notes  °[facility] [address], [place], [place] 336-878-6098   °[facility] [address], [place], [place] 336-832-9800   °[facility] [address],  [place], [place] ° 336-882-2125   °[facility] [address],  °[place], [place] 1-800-853-5163 or 336-641-4981   °Substance Abuse Resources °Organization         Address  Phone  Notes  °[facility]  336-882-2125   °[facility]  336-784-9470   °[facility]  336-285-9073   °[facility]  336-845-3988   °Residential & Outpatient Substance Abuse Program  1-800-659-3381   °Psychological Services °Organization         Address  Phone  Notes  °[facility]  336- 832-9600   °[facility]  336- 378-7881   °[facility] [address], [place] 1-800-853-5163 or 336-641-4981   ° °Mobile Crisis Teams °Organization         Address  Phone  Notes  °[facility], Mobile Crisis Care Unit  1-877-626-1772   °Assertive °[facility] ° [address]  [place], [place] 336-834-9664   °Sharon DeEsch [address] °[place] 336-554-5454   ° °Self-Help/Support Groups °Organization         Address  Phone             Notes  °[facility] - variety of support groups  336- 373-1402 Call for more information  °Narcotics Anonymous (NA), [facility] [address], °[place]  2 meetings at this location  ° °  Residential Treatment Programs °Organization         Address  Phone  Notes  °[facility] [address],    °[place]  1-866-801-8205   °[facility] ° [address], [place], [place] 704-293-8524   °[facility] [address], [place] 336-845-3988 Admissions: 8am-3pm M-F  °[facility] [address]    °[place], [place] 336-841-1104   °[facility] [address], [place], [place] 336-379-7146   °[facility] [address]  °[place], [place] 336-285-9073   °[facility] - Intensive Outpatient [address], [place], [place] 336-852-3033   °[facility] [address]  °[place], [place] 1-877-615-2722 or 336-784-9470   °[facility] [address] [place], [place] 336-227-7417 Accepts Medicaid  °[facility] [address]  ° [place] 1-800-659-3381 Substance Abuse/Addiction Treatment  ° °[place] Behavioral Health Resources °Organization         Address  Phone  Notes  °[facility]  (888) 581-9988   °Julie Brannon, PhD [address] [place], [place]   (336) 349-5553 or (336) 951-0000   °[facility]   [address] °[place], [place] (336) 349-4454   °[facility] [address], [place], [place] (336) 342-8316 Insurance/Medicaid/sponsorship through [facility]  °[facility] [address]                                    [place], [place] (336) 342-8316 Therapy/tele-psych/case    °[facility] [address]  ° [place], [place] (336) 349-2233    °Dr. Arfeen  (336) 349-4544   °[facility] [place]  [employer] [place] [facility] 1) [address], [place] °2) [address], [place] °3)  [address], [place] (336) 349-3220 °(336) 342-7768 ° °(336) 342-8140   °[place] [facility] (336) 342-1394 or (336) 342-3537 (After Hours)    ° ° °

## 2014-06-26 ENCOUNTER — Emergency Department (HOSPITAL_COMMUNITY)
Admission: EM | Admit: 2014-06-26 | Discharge: 2014-06-26 | Disposition: A | Discharge status: Home / Self Care | Payer: BLUE CROSS/BLUE SHIELD | Attending: Emergency Medicine | Admitting: Emergency Medicine

## 2014-06-26 ENCOUNTER — Encounter (HOSPITAL_COMMUNITY): Payer: Self-pay | Admitting: Emergency Medicine

## 2014-06-26 DIAGNOSIS — Z79899 Other long term (current) drug therapy: Secondary | ICD-10-CM | POA: Insufficient documentation

## 2014-06-26 DIAGNOSIS — K529 Noninfective gastroenteritis and colitis, unspecified: Secondary | ICD-10-CM | POA: Insufficient documentation

## 2014-06-26 DIAGNOSIS — Z87891 Personal history of nicotine dependence: Secondary | ICD-10-CM | POA: Insufficient documentation

## 2014-06-26 DIAGNOSIS — Z8711 Personal history of peptic ulcer disease: Secondary | ICD-10-CM | POA: Insufficient documentation

## 2014-06-26 DIAGNOSIS — Z8659 Personal history of other mental and behavioral disorders: Secondary | ICD-10-CM | POA: Insufficient documentation

## 2014-06-26 DIAGNOSIS — Z8679 Personal history of other diseases of the circulatory system: Secondary | ICD-10-CM | POA: Insufficient documentation

## 2014-06-26 DIAGNOSIS — J45909 Unspecified asthma, uncomplicated: Secondary | ICD-10-CM | POA: Insufficient documentation

## 2014-06-26 DIAGNOSIS — E860 Dehydration: Secondary | ICD-10-CM | POA: Insufficient documentation

## 2014-06-26 DIAGNOSIS — K219 Gastro-esophageal reflux disease without esophagitis: Secondary | ICD-10-CM | POA: Insufficient documentation

## 2014-06-26 DIAGNOSIS — Z8774 Personal history of (corrected) congenital malformations of heart and circulatory system: Secondary | ICD-10-CM | POA: Insufficient documentation

## 2014-06-26 LAB — CBC WITH DIFFERENTIAL/PLATELET
BASOS ABS: 0 10*3/uL (ref 0.0–0.1)
Basophils Relative: 0 % (ref 0–1)
EOS PCT: 1 % (ref 0–5)
Eosinophils Absolute: 0.1 10*3/uL (ref 0.0–0.7)
HCT: 48.3 % (ref 39.0–52.0)
HEMOGLOBIN: 16.3 g/dL (ref 13.0–17.0)
LYMPHS ABS: 1.5 10*3/uL (ref 0.7–4.0)
LYMPHS PCT: 13 % (ref 12–46)
MCH: 30.2 pg (ref 26.0–34.0)
MCHC: 33.7 g/dL (ref 30.0–36.0)
MCV: 89.4 fL (ref 78.0–100.0)
MONO ABS: 1 10*3/uL (ref 0.1–1.0)
Monocytes Relative: 9 % (ref 3–12)
NEUTROS ABS: 8.6 10*3/uL — AB (ref 1.7–7.7)
Neutrophils Relative %: 77 % (ref 43–77)
Platelets: 189 10*3/uL (ref 150–400)
RBC: 5.4 MIL/uL (ref 4.22–5.81)
RDW: 13.2 % (ref 11.5–15.5)
WBC: 11.2 10*3/uL — ABNORMAL HIGH (ref 4.0–10.5)

## 2014-06-26 LAB — COMPREHENSIVE METABOLIC PANEL
ALT: 51 U/L (ref 17–63)
AST: 38 U/L (ref 15–41)
Albumin: 4.3 g/dL (ref 3.5–5.0)
Alkaline Phosphatase: 81 U/L (ref 38–126)
Anion gap: 11 (ref 5–15)
BUN: 10 mg/dL (ref 6–20)
CO2: 23 mmol/L (ref 22–32)
Calcium: 8.9 mg/dL (ref 8.9–10.3)
Chloride: 103 mmol/L (ref 101–111)
Creatinine, Ser: 0.89 mg/dL (ref 0.61–1.24)
GLUCOSE: 126 mg/dL — AB (ref 65–99)
POTASSIUM: 3.6 mmol/L (ref 3.5–5.1)
Sodium: 137 mmol/L (ref 135–145)
Total Bilirubin: 0.9 mg/dL (ref 0.3–1.2)
Total Protein: 7.9 g/dL (ref 6.5–8.1)

## 2014-06-26 LAB — LIPASE, BLOOD: LIPASE: 31 U/L (ref 22–51)

## 2014-06-26 MED ORDER — MORPHINE SULFATE 4 MG/ML IJ SOLN
4.0000 mg | INTRAMUSCULAR | Status: DC | PRN
  Administered 2014-06-26: 4 mg via INTRAVENOUS
  Filled 2014-06-26: qty 1

## 2014-06-26 MED ORDER — PROMETHAZINE HCL 25 MG PO TABS: 25.0000 mg | ORAL_TABLET | Freq: Four times a day (QID) | ORAL | Status: DC | PRN

## 2014-06-26 MED ORDER — SODIUM CHLORIDE 0.9 % IV BOLUS (SEPSIS)
1000.0000 mL | Freq: Once | INTRAVENOUS | Status: AC
  Administered 2014-06-26: 1000 mL via INTRAVENOUS

## 2014-06-26 MED ORDER — MORPHINE SULFATE 4 MG/ML IJ SOLN
4.0000 mg | Freq: Once | INTRAMUSCULAR | Status: AC
  Administered 2014-06-26: 4 mg via INTRAVENOUS
  Filled 2014-06-26: qty 1

## 2014-06-26 MED ORDER — DIPHENOXYLATE-ATROPINE 2.5-0.025 MG PO TABS: 1.0000 | ORAL_TABLET | Freq: Four times a day (QID) | ORAL | Status: DC | PRN

## 2014-06-26 MED ORDER — ONDANSETRON HCL 4 MG/2ML IJ SOLN
4.0000 mg | Freq: Once | INTRAMUSCULAR | Status: AC
  Administered 2014-06-26: 4 mg via INTRAVENOUS
  Filled 2014-06-26: qty 2

## 2014-06-26 MED ORDER — SODIUM CHLORIDE 0.9 % IV BOLUS (SEPSIS): 1000.0000 mL | Freq: Once | INTRAVENOUS | Status: DC

## 2014-06-26 NOTE — ED Provider Notes (Signed)
CSN: [id number]     Arrival date & time 06/26/14  1409 History   First MD Initiated Contact with Patient 06/26/14 1505     Chief Complaint  Patient presents with  . Abdominal Pain     (Consider location/radiation/quality/duration/timing/severity/associated sxs/prior Treatment) HPI.... Multiple episodes of vomiting and diarrhea for the past 2 days with associated abdominal pain. Patient is keeping some food and liquids down. He is status post ventricular septal defect repair at [facility] and February 2013. He now has a pacemaker. No blood in the stool or emesis. No fever or chills. Severity is moderate.  Past Medical History  Diagnosis Date  . GERD (gastroesophageal reflux disease)   . Asthma   . PUD (peptic ulcer disease)   . Asthma   . Anxiety   . Back pain   . Knee pain   . Perimembranous ventricular septal defect     repaired at [facility] with patch 03/01/11  . Pulmonary hypertension   . Complete heart block     s/p epicardial PPM implant at [facility] 03/01/11   Past Surgical History  Procedure Laterality Date  . Eye surgery    . Tonsillectomy    . Vsd repair  03/01/11    patch repair at [facility]  . Pacemaker insertion  03/01/11    epicardial MDT pacing system implanted at [facility] History  Problem Relation Age of Onset  . Sudden death      paternal great uncle and paternal great grandfather both died suddenly at home   History  Substance Use Topics  . Smoking status: Former Smoker -- 0.50 packs/day for 2 years    Types: Cigarettes    Quit date: 02/07/2011  . Smokeless tobacco: Never Used     Comment: 1/4 pack a day.  . Alcohol Use: No    Review of Systems  All other systems reviewed and are negative.     Allergies  Sulfa antibiotics  Home Medications   Prior to Admission medications   Medication Sig Start Date End Date Taking? Authorizing Provider  albuterol (PROVENTIL HFA;VENTOLIN HFA) 108 (90 BASE) MCG/ACT inhaler Inhale 2 puffs into the lungs every 6  (six) hours as needed. For shortness of breath   Yes Historical Provider, MD  methocarbamol (ROBAXIN) 500 MG tablet Take 500 mg by mouth 4 (four) times daily as needed for muscle spasms.   Yes Historical Provider, MD  montelukast (SINGULAIR) 10 MG tablet Take 10 mg by mouth at bedtime.   Yes Historical Provider, MD  pantoprazole (PROTONIX) 40 MG tablet Take 1 tablet (40 mg total) by mouth 2 (two) times daily before a meal. 02/24/11 06/26/14 Yes Freeman Caldron, PA-C  diphenoxylate-atropine (LOMOTIL) 2.5-0.025 MG per tablet Take 1 tablet by mouth 4 (four) times daily as needed for diarrhea or loose stools. 06/26/14   Donnetta Hutching, MD  ibuprofen (ADVIL,MOTRIN) 800 MG tablet Take 1 tablet (800 mg total) by mouth 3 (three) times daily. Patient not taking: Reported on 01/10/2014 01/08/14   Kathrynn Speed, PA-C  ondansetron (ZOFRAN ODT) 4 MG disintegrating tablet  ODT q4 hours prn nausea/vomit Patient not taking: Reported on 01/10/2014 01/08/14   Kathrynn Speed, PA-C  promethazine (PHENERGAN) 25 MG tablet Take 1 tablet (25 mg total) by mouth every 6 (six) hours as needed. 06/26/14   Donnetta Hutching, MD   BP 105/79 mmHg  Pulse 59  Temp(Src) 97.9 F (36.6 C)  Resp 20  Ht 6' (1.829 m)  Wt 258 lb (  117.028 kg)  BMI 34.98 kg/m2  SpO2 97% Physical Exam  Constitutional: He is oriented to person, place, and time.  Looks slightly dehydrated  HENT:  Head: Normocephalic and atraumatic.  Eyes: Conjunctivae and EOM are normal. Pupils are equal, round, and reactive to light.  Neck: Normal range of motion. Neck supple.  Cardiovascular: Normal rate and regular rhythm.   Pulmonary/Chest: Effort normal and breath sounds normal.  Abdominal: Soft. Bowel sounds are normal.  Nontender  Musculoskeletal: Normal range of motion.  Neurological: He is alert and oriented to person, place, and time.  Skin: Skin is warm and dry.  Psychiatric: He has a normal mood and affect. His behavior is normal.  Nursing note and vitals  reviewed.   ED Course  Procedures (including critical care time) Labs Review Labs Reviewed  COMPREHENSIVE METABOLIC PANEL - Abnormal; Notable for the following:    Glucose, Bld 126 (*)    All other components within normal limits  CBC WITH DIFFERENTIAL/PLATELET - Abnormal; Notable for the following:    WBC 11.2 (*)    Neutro Abs 8.6 (*)    All other components within normal limits  LIPASE, BLOOD    Imaging Review No results found.   EKG Interpretation None      MDM   Final diagnoses:  Gastroenteritis    Patient feels better after 2 L of IV fluids. Hemoglobin, glucose, potassium, sodium all within normal limits. Discharge medications Phenergan 25 mg and Lomotil    Donnetta Hutching, MD 06/26/14 2005

## 2014-06-26 NOTE — ED Notes (Signed)
abd pain/n/v/d x 3 days

## 2014-06-26 NOTE — Discharge Instructions (Signed)
Medication for nausea and diarrhea. Clear liquids. Return if worse.

## 2014-06-26 NOTE — ED Notes (Signed)
Gave pt Sprite to drink with MD Adriana Simas approval. Pt tolerating well at this time.

## 2014-06-26 NOTE — ED Notes (Signed)
Pt c/o increased pain in abdomen. MD Adriana Simas made aware.

## 2014-06-26 NOTE — ED Notes (Signed)
MD Cook at bedside updating patient and family.  

## 2016-02-09 ENCOUNTER — Encounter: Payer: Self-pay | Admitting: Internal Medicine

## 2017-08-14 ENCOUNTER — Telehealth (INDEPENDENT_AMBULATORY_CARE_PROVIDER_SITE_OTHER): Payer: Self-pay | Admitting: *Deleted

## 2017-08-14 NOTE — Telephone Encounter (Signed)
His Mother states that Dr.Shah had sent a referral for Neil Sanders. She says that he will see [name] or Dr.Rehman. Very concerned about him.  He has been treated with Protonix 40 mg twice a day and this has not helped. Doctors are not sure what the cause is.  Would appreciate an appointment soon.

## 2017-08-16 ENCOUNTER — Ambulatory Visit (INDEPENDENT_AMBULATORY_CARE_PROVIDER_SITE_OTHER): Payer: BLUE CROSS/BLUE SHIELD | Admitting: Internal Medicine

## 2017-08-16 ENCOUNTER — Encounter (INDEPENDENT_AMBULATORY_CARE_PROVIDER_SITE_OTHER): Payer: Self-pay | Admitting: Internal Medicine

## 2017-08-16 ENCOUNTER — Telehealth (INDEPENDENT_AMBULATORY_CARE_PROVIDER_SITE_OTHER): Payer: Self-pay | Admitting: Internal Medicine

## 2017-08-16 VITALS — BP 130/100 | HR 68 | Resp 18 | Ht 72.0 in | Wt 243.9 lb

## 2017-08-16 DIAGNOSIS — R110 Nausea: Secondary | ICD-10-CM

## 2017-08-16 DIAGNOSIS — K219 Gastro-esophageal reflux disease without esophagitis: Secondary | ICD-10-CM | POA: Diagnosis not present

## 2017-08-16 DIAGNOSIS — R112 Nausea with vomiting, unspecified: Secondary | ICD-10-CM | POA: Diagnosis not present

## 2017-08-16 NOTE — Progress Notes (Signed)
Subjective:    Patient ID: Neil Sanders, male    DOB: 08/05/1990, 27 y.o.   MRN: [id number]  HPI Referred by Dr. Sherryll BurgerShah for GERD. He has some nausea that may last for several days. In the morning he may vomit acid. Sometimes he has nausea after eating.  Maintained on Protonix BID for chronic GERD which is controlled. Weight loss of about 20 pounds which was intentional. Has a BM x 1 a day.  No NSAIDs   He eats 3 meals a day.  He is trying to avoid fatty foods. Hx of chronic nausea. He has no new symptoms.  Last EGD in 2011 and he had erosive/ulcerative reflux esophagitis and focal gastritis.     Smoker.  Hx of Complete heart block and has a pacer.   Unemployed.  Mother in hospice. Father had liver transplant.  Review of Systems Past Medical History:  Diagnosis Date  . Anxiety   . Asthma   . Asthma   . Back pain   . Complete heart block (HCC)    [facility]11  . GERD (gastroesophageal reflux disease)   . Knee pain   . Perimembranous ventricular septal defect    repaired at [facility] with patch 03/01/11  . PUD (peptic ulcer disease)   . Pulmonary hypertension (HCC)     Past Surgical History:  Procedure Laterality Date  . EYE SURGERY    . PACEMAKER INSERTION  03/01/11   [facility]  . TONSILLECTOMY    . VSD REPAIR  03/01/11   [facility]    Allergies  Allergen Reactions  . Bee Venom Rash    Swelling  . Sulfa Antibiotics Swelling    Throat swelling     Current Outpatient Medications on File Prior to Visit  Medication Sig Dispense Refill  . albuterol (PROVENTIL HFA;VENTOLIN HFA) 108 (90 BASE) MCG/ACT inhaler Inhale 2 puffs into the lungs every 6 (six) hours as needed. For shortness of breath    . diphenoxylate-atropine (LOMOTIL) 2.5-0.025 MG per tablet Take 1 tablet by mouth 4 (four) times daily as needed for diarrhea or loose stools. 20 tablet 0  . ibuprofen (ADVIL,MOTRIN) 800 MG tablet  Take 1 tablet (800 mg total) by mouth 3 (three) times daily. 21 tablet 0  . loratadine (CLARITIN) 10 MG tablet Take 10 mg by mouth daily.    . ondansetron (ZOFRAN ODT) 4 MG disintegrating tablet 4mg  ODT q4 hours prn nausea/vomit 10 tablet 0  . pantoprazole (PROTONIX) 40 MG tablet Take 40 mg by mouth 2 (two) times daily before a meal.    . promethazine (PHENERGAN) 25 MG tablet Take 1 tablet (25 mg total) by mouth every 6 (six) hours as needed. 15 tablet 0   No current facility-administered medications on file prior to visit.    '     Objective:   Physical Exam Blood pressure (!) 130/100, pulse 68, resp. rate 18, height 6' (1.829 m), weight 243 lb 14.4 oz (110.6 kg). Alert and oriented. Skin warm and dry. Oral mucosa is moist.   . Sclera anicteric, conjunctivae is pink. Thyroid not enlarged. No cervical lymphadenopathy. Lungs clear. Heart regular rate and rhythm.  Abdomen is soft. Bowel sounds are positive. No hepatomegaly. No abdominal masses felt. No tenderness.  No edema to lower extremities.          Assessment & Plan:  GERD. Continue the Protonix BID.  Nausea:  Continue the  Zofran and Phenergan. Has nausea at time after eating. Am going to get a HIDA scan to be sure his GB is functioning.  Further recommendations to follow.

## 2017-08-16 NOTE — Patient Instructions (Addendum)
Will discuss with Dr. Karilyn Cotaehman Gastroesophageal Reflux Disease, Adult Normally, food travels down the esophagus and stays in the stomach to be digested. If a person has gastroesophageal reflux disease (GERD), food and stomach acid move back up into the esophagus. When this happens, the esophagus becomes sore and swollen (inflamed). Over time, GERD can make small holes (ulcers) in the lining of the esophagus. Follow these instructions at home: Diet  Follow a diet as told by your doctor. You may need to avoid foods and drinks such as: ? Coffee and tea (with or without caffeine). ? Drinks that contain alcohol. ? Energy drinks and sports drinks. ? Carbonated drinks or sodas. ? Chocolate and cocoa. ? Peppermint and mint flavorings. ? Garlic and onions. ? Horseradish. ? Spicy and acidic foods, such as peppers, chili powder, curry powder, vinegar, hot sauces, and BBQ sauce. ? Citrus fruit juices and citrus fruits, such as oranges, lemons, and limes. ? Tomato-based foods, such as red sauce, chili, salsa, and pizza with red sauce. ? Fried and fatty foods, such as donuts, french fries, potato chips, and high-fat dressings. ? High-fat meats, such as hot dogs, rib eye steak, sausage, ham, and bacon. ? High-fat dairy items, such as whole milk, butter, and cream cheese.  Eat small meals often. Avoid eating large meals.  Avoid drinking large amounts of liquid with your meals.  Avoid eating meals during the 2-3 hours before bedtime.  Avoid lying down right after you eat.  Do not exercise right after you eat. General instructions  Pay attention to any changes in your symptoms.  Take over-the-counter and prescription medicines only as told by your doctor. Do not take aspirin, ibuprofen, or other NSAIDs unless your doctor says it is okay.  Do not use any tobacco products, including cigarettes, chewing tobacco, and e-cigarettes. If you need help quitting, ask your doctor.  Wear loose clothes. Do not  wear anything tight around your waist.  Raise (elevate) the head of your bed about 6 inches (15 cm).  Try to lower your stress. If you need help doing this, ask your doctor.  If you are overweight, lose an amount of weight that is healthy for you. Ask your doctor about a safe weight loss goal.  Keep all follow-up visits as told by your doctor. This is important. Contact a doctor if:  You have new symptoms.  You lose weight and you do not know why it is happening.  You have trouble swallowing, or it hurts to swallow.  You have wheezing or a cough that keeps happening.  Your symptoms do not get better with treatment.  You have a hoarse voice. Get help right away if:  You have pain in your arms, neck, jaw, teeth, or back.  You feel sweaty, dizzy, or light-headed.  You have chest pain or shortness of breath.  You throw up (vomit) and your throw up looks like blood or coffee grounds.  You pass out (faint).  Your poop (stool) is bloody or black.  You cannot swallow, drink, or eat. This information is not intended to replace advice given to you by your health care provider. Make sure you discuss any questions you have with your health care provider. Document Released: 06/08/2007 Document Revised: 05/28/2015 Document Reviewed: 04/16/2014 Elsevier Interactive Patient Education  2018 [employer]  Gastroesophageal Reflux Disease, Adult Normally, food travels down the esophagus and stays in the stomach to be digested. If a person has gastroesophageal reflux disease (GERD), food and stomach acid move  back up into the esophagus. When this happens, the esophagus becomes sore and swollen (inflamed). Over time, GERD can make small holes (ulcers) in the lining of the esophagus. Follow these instructions at home: Diet  Follow a diet as told by your doctor. You may need to avoid foods and drinks such as: ? Coffee and tea (with or without caffeine). ? Drinks that contain  alcohol. ? Energy drinks and sports drinks. ? Carbonated drinks or sodas. ? Chocolate and cocoa. ? Peppermint and mint flavorings. ? Garlic and onions. ? Horseradish. ? Spicy and acidic foods, such as peppers, chili powder, curry powder, vinegar, hot sauces, and BBQ sauce. ? Citrus fruit juices and citrus fruits, such as oranges, lemons, and limes. ? Tomato-based foods, such as red sauce, chili, salsa, and pizza with red sauce. ? Fried and fatty foods, such as donuts, french fries, potato chips, and high-fat dressings. ? High-fat meats, such as hot dogs, rib eye steak, sausage, ham, and bacon. ? High-fat dairy items, such as whole milk, butter, and cream cheese.  Eat small meals often. Avoid eating large meals.  Avoid drinking large amounts of liquid with your meals.  Avoid eating meals during the 2-3 hours before bedtime.  Avoid lying down right after you eat.  Do not exercise right after you eat. General instructions  Pay attention to any changes in your symptoms.  Take over-the-counter and prescription medicines only as told by your doctor. Do not take aspirin, ibuprofen, or other NSAIDs unless your doctor says it is okay.  Do not use any tobacco products, including cigarettes, chewing tobacco, and e-cigarettes. If you need help quitting, ask your doctor.  Wear loose clothes. Do not wear anything tight around your waist.  Raise (elevate) the head of your bed about 6 inches (15 cm).  Try to lower your stress. If you need help doing this, ask your doctor.  If you are overweight, lose an amount of weight that is healthy for you. Ask your doctor about a safe weight loss goal.  Keep all follow-up visits as told by your doctor. This is important. Contact a doctor if:  You have new symptoms.  You lose weight and you do not know why it is happening.  You have trouble swallowing, or it hurts to swallow.  You have wheezing or a cough that keeps happening.  Your symptoms do  not get better with treatment.  You have a hoarse voice. Get help right away if:  You have pain in your arms, neck, jaw, teeth, or back.  You feel sweaty, dizzy, or light-headed.  You have chest pain or shortness of breath.  You throw up (vomit) and your throw up looks like blood or coffee grounds.  You pass out (faint).  Your poop (stool) is bloody or black.  You cannot swallow, drink, or eat. This information is not intended to replace advice given to you by your health care provider. Make sure you discuss any questions you have with your health care provider. Document Released: 06/08/2007 Document Revised: 05/28/2015 Document Reviewed: 04/16/2014 Elsevier Interactive Patient Education  2018 [employer]  Food Choices for Gastroesophageal Reflux Disease, Adult When you have gastroesophageal reflux disease (GERD), the foods you eat and your eating habits are very important. Choosing the right foods can help ease your discomfort. What guidelines do I need to follow?  Choose fruits, vegetables, whole grains, and low-fat dairy products.  Choose low-fat meat, fish, and poultry.  Limit fats such as oils, salad dressings,  butter, nuts, and avocado.  Keep a food diary. This helps you identify foods that cause symptoms.  Avoid foods that cause symptoms. These may be different for everyone.  Eat small meals often instead of 3 large meals a day.  Eat your meals slowly, in a place where you are relaxed.  Limit fried foods.  Cook foods using methods other than frying.  Avoid drinking alcohol.  Avoid drinking large amounts of liquids with your meals.  Avoid bending over or lying down until 2-3 hours after eating. What foods are not recommended? These are some foods and drinks that may make your symptoms worse: Vegetables Tomatoes. Tomato juice. Tomato and spaghetti sauce. Chili peppers. Onion and garlic. Horseradish. Fruits Oranges, grapefruit, and lemon (fruit and  juice). Meats High-fat meats, fish, and poultry. This includes hot dogs, ribs, ham, sausage, salami, and bacon. Dairy Whole milk and chocolate milk. Sour cream. Cream. Butter. Ice cream. Cream cheese. Drinks Coffee and tea. Bubbly (carbonated) drinks or energy drinks. Condiments Hot sauce. Barbecue sauce. Sweets/Desserts Chocolate and cocoa. Donuts. Peppermint and spearmint. Fats and Oils High-fat foods. This includes [place] fries and potato chips. Other Vinegar. Strong spices. This includes black pepper, white pepper, red pepper, cayenne, curry powder, cloves, ginger, and chili powder. The items listed above may not be a complete list of foods and drinks to avoid. Contact your dietitian for more information. This information is not intended to replace advice given to you by your health care provider. Make sure you discuss any questions you have with your health care provider. Document Released: 06/21/2011 Document Revised: 05/28/2015 Document Reviewed: 10/24/2012 Elsevier Interactive Patient Education  2017 [employer].

## 2017-08-16 NOTE — Telephone Encounter (Signed)
Ann, HIDA scan. Order is in

## 2017-08-16 NOTE — Telephone Encounter (Signed)
HIDA scan sch'd 08/25/17 at 10:00 (945), npo after midnight, patient aware

## 2017-08-25 ENCOUNTER — Encounter (HOSPITAL_COMMUNITY): Admission: RE | Admit: 2017-08-25 | Payer: BLUE CROSS/BLUE SHIELD | Source: Ambulatory Visit

## 2017-08-28 ENCOUNTER — Encounter (HOSPITAL_COMMUNITY)
Admission: RE | Admit: 2017-08-28 | Discharge: 2017-08-28 | Disposition: A | Discharge status: Home / Self Care | Payer: BLUE CROSS/BLUE SHIELD | Source: Ambulatory Visit | Attending: Internal Medicine | Admitting: Internal Medicine

## 2017-08-28 DIAGNOSIS — R112 Nausea with vomiting, unspecified: Secondary | ICD-10-CM | POA: Insufficient documentation

## 2017-08-28 DIAGNOSIS — R110 Nausea: Secondary | ICD-10-CM

## 2017-08-28 MED ORDER — TECHNETIUM TC 99M MEBROFENIN IV KIT
5.0000 | PACK | Freq: Once | INTRAVENOUS | Status: AC | PRN
  Administered 2017-08-28: 5 via INTRAVENOUS

## 2017-08-30 ENCOUNTER — Telehealth (INDEPENDENT_AMBULATORY_CARE_PROVIDER_SITE_OTHER): Payer: Self-pay | Admitting: Internal Medicine

## 2017-08-30 NOTE — Telephone Encounter (Signed)
Mother called stated patient wanted to know test results - patient is still having issues - please call [phone]

## 2017-08-30 NOTE — Telephone Encounter (Signed)
Patient is not at home. Will call later.

## 2017-08-30 NOTE — Telephone Encounter (Signed)
I spoke with mother. Will discuss with Dr. Karilyn Cotaehman

## 2017-09-01 ENCOUNTER — Telehealth (INDEPENDENT_AMBULATORY_CARE_PROVIDER_SITE_OTHER): Payer: Self-pay | Admitting: *Deleted

## 2017-09-01 NOTE — Telephone Encounter (Signed)
Patient's grandmother called asking that we call Neil Sanders the patient's mother to give the results of his recent NM Hepato/w eject Fraction. That they had never heard nothing. She also states that Neil Sanders is now sleeping a lot.  [name]'s telephone encounter states that on the 28 of August she talked with the patient's mother about the result and advised that she was going to talk with Dr.Rehman about this as the patient is not feeling any better. Prior to that [name] had called but he,the patient , was not at home.  Forwarded to Camelia Engerri to address when she returns Tuesday September 3 rd.

## 2017-09-05 ENCOUNTER — Telehealth (INDEPENDENT_AMBULATORY_CARE_PROVIDER_SITE_OTHER): Payer: Self-pay | Admitting: Internal Medicine

## 2017-09-05 DIAGNOSIS — R112 Nausea with vomiting, unspecified: Secondary | ICD-10-CM

## 2017-09-05 DIAGNOSIS — R1013 Epigastric pain: Secondary | ICD-10-CM

## 2017-09-05 DIAGNOSIS — R634 Abnormal weight loss: Secondary | ICD-10-CM

## 2017-09-05 NOTE — Telephone Encounter (Signed)
This has been addressed. CT abdomen/pelvis with CM.

## 2017-09-05 NOTE — Telephone Encounter (Signed)
[place] sch'd 09/11/17 at 830 (815), npo after midnight, patient aware

## 2017-09-05 NOTE — Telephone Encounter (Signed)
Please call patient - he is still vomiting and is not getting any better - would like to get test results - ph# [phone]

## 2017-09-05 NOTE — Telephone Encounter (Signed)
Will get an US

## 2017-09-05 NOTE — Telephone Encounter (Signed)
I have spoken with mother

## 2017-09-05 NOTE — Telephone Encounter (Signed)
Ann, [place] order in

## 2017-09-05 NOTE — Telephone Encounter (Signed)
Ann, Ct abdomen pelvis with CM

## 2017-09-05 NOTE — Telephone Encounter (Signed)
err

## 2017-09-05 NOTE — Telephone Encounter (Signed)
[employer] isn;t approving CT A/P -- please advise

## 2017-09-11 ENCOUNTER — Ambulatory Visit (HOSPITAL_COMMUNITY)
Admission: RE | Admit: 2017-09-11 | Discharge: 2017-09-11 | Disposition: A | Discharge status: Home / Self Care | Payer: BLUE CROSS/BLUE SHIELD | Source: Ambulatory Visit | Attending: Internal Medicine | Admitting: Internal Medicine

## 2017-09-11 DIAGNOSIS — K760 Fatty (change of) liver, not elsewhere classified: Secondary | ICD-10-CM | POA: Diagnosis not present

## 2017-09-11 DIAGNOSIS — R161 Splenomegaly, not elsewhere classified: Secondary | ICD-10-CM | POA: Insufficient documentation

## 2017-09-11 DIAGNOSIS — R112 Nausea with vomiting, unspecified: Secondary | ICD-10-CM | POA: Insufficient documentation

## 2017-09-11 DIAGNOSIS — R1013 Epigastric pain: Secondary | ICD-10-CM | POA: Insufficient documentation

## 2017-09-11 DIAGNOSIS — R634 Abnormal weight loss: Secondary | ICD-10-CM | POA: Insufficient documentation

## 2017-09-15 ENCOUNTER — Telehealth (INDEPENDENT_AMBULATORY_CARE_PROVIDER_SITE_OTHER): Payer: Self-pay | Admitting: Internal Medicine

## 2017-09-15 ENCOUNTER — Other Ambulatory Visit (INDEPENDENT_AMBULATORY_CARE_PROVIDER_SITE_OTHER): Payer: Self-pay | Admitting: Internal Medicine

## 2017-09-15 ENCOUNTER — Telehealth (INDEPENDENT_AMBULATORY_CARE_PROVIDER_SITE_OTHER): Payer: Self-pay | Admitting: *Deleted

## 2017-09-15 DIAGNOSIS — R1013 Epigastric pain: Secondary | ICD-10-CM

## 2017-09-15 NOTE — Telephone Encounter (Signed)
Neil BallRobin called and left message, states [name] isn't any better, they want to proceed with EGD -- I didn't see it noted anywhere patient needed, please advise

## 2017-09-15 NOTE — Telephone Encounter (Signed)
Ann, EGD 

## 2017-09-15 NOTE — Telephone Encounter (Signed)
I dont't think so. Dr. Karilyn Cotaehman is coming in before we leave and we can ask him.

## 2017-09-15 NOTE — Telephone Encounter (Signed)
Will he need with propofol

## 2017-09-15 NOTE — Telephone Encounter (Signed)
Does patient need with propofol

## 2017-09-15 NOTE — Telephone Encounter (Signed)
Dr. Karilyn Cotaehman has chart

## 2017-09-17 NOTE — Telephone Encounter (Signed)
Yes he will need Propofol

## 2017-09-18 ENCOUNTER — Other Ambulatory Visit (INDEPENDENT_AMBULATORY_CARE_PROVIDER_SITE_OTHER): Payer: Self-pay | Admitting: Internal Medicine

## 2017-09-18 ENCOUNTER — Encounter (INDEPENDENT_AMBULATORY_CARE_PROVIDER_SITE_OTHER): Payer: Self-pay | Admitting: *Deleted

## 2017-09-18 DIAGNOSIS — R1013 Epigastric pain: Principal | ICD-10-CM

## 2017-09-18 DIAGNOSIS — G8929 Other chronic pain: Secondary | ICD-10-CM | POA: Insufficient documentation

## 2017-09-18 NOTE — Telephone Encounter (Signed)
EGD w propofol sch'd 10/06/17 at 1230 (11), preop 10/2 at 215, patient aware, instructions mailed

## 2017-09-30 ENCOUNTER — Other Ambulatory Visit: Payer: Self-pay

## 2017-09-30 ENCOUNTER — Emergency Department (HOSPITAL_COMMUNITY): Payer: BLUE CROSS/BLUE SHIELD

## 2017-09-30 ENCOUNTER — Encounter (HOSPITAL_COMMUNITY): Payer: Self-pay | Admitting: Emergency Medicine

## 2017-09-30 ENCOUNTER — Emergency Department (HOSPITAL_COMMUNITY)
Admission: EM | Admit: 2017-09-30 | Discharge: 2017-10-01 | Disposition: A | Discharge status: Home / Self Care | Payer: BLUE CROSS/BLUE SHIELD | Attending: Emergency Medicine | Admitting: Emergency Medicine

## 2017-09-30 DIAGNOSIS — R1033 Periumbilical pain: Secondary | ICD-10-CM | POA: Diagnosis not present

## 2017-09-30 DIAGNOSIS — R1032 Left lower quadrant pain: Secondary | ICD-10-CM | POA: Insufficient documentation

## 2017-09-30 DIAGNOSIS — R112 Nausea with vomiting, unspecified: Secondary | ICD-10-CM

## 2017-09-30 DIAGNOSIS — F1110 Opioid abuse, uncomplicated: Secondary | ICD-10-CM

## 2017-09-30 DIAGNOSIS — R1031 Right lower quadrant pain: Secondary | ICD-10-CM | POA: Diagnosis not present

## 2017-09-30 DIAGNOSIS — K2980 Duodenitis without bleeding: Secondary | ICD-10-CM

## 2017-09-30 DIAGNOSIS — Z634 Disappearance and death of family member: Secondary | ICD-10-CM | POA: Insufficient documentation

## 2017-09-30 DIAGNOSIS — F1990 Other psychoactive substance use, unspecified, uncomplicated: Secondary | ICD-10-CM | POA: Diagnosis not present

## 2017-09-30 DIAGNOSIS — R197 Diarrhea, unspecified: Secondary | ICD-10-CM

## 2017-09-30 DIAGNOSIS — Z950 Presence of cardiac pacemaker: Secondary | ICD-10-CM | POA: Diagnosis not present

## 2017-09-30 DIAGNOSIS — R109 Unspecified abdominal pain: Secondary | ICD-10-CM

## 2017-09-30 DIAGNOSIS — G8929 Other chronic pain: Secondary | ICD-10-CM | POA: Diagnosis not present

## 2017-09-30 DIAGNOSIS — F17210 Nicotine dependence, cigarettes, uncomplicated: Secondary | ICD-10-CM | POA: Insufficient documentation

## 2017-09-30 DIAGNOSIS — R45851 Suicidal ideations: Secondary | ICD-10-CM

## 2017-09-30 DIAGNOSIS — F333 Major depressive disorder, recurrent, severe with psychotic symptoms: Secondary | ICD-10-CM | POA: Diagnosis not present

## 2017-09-30 DIAGNOSIS — Z008 Encounter for other general examination: Secondary | ICD-10-CM | POA: Diagnosis present

## 2017-09-30 LAB — URINALYSIS, ROUTINE W REFLEX MICROSCOPIC
BILIRUBIN URINE: NEGATIVE
Glucose, UA: NEGATIVE mg/dL
HGB URINE DIPSTICK: NEGATIVE
KETONES UR: 5 mg/dL — AB
Leukocytes, UA: NEGATIVE
Nitrite: NEGATIVE
PH: 7 (ref 5.0–8.0)
Protein, ur: NEGATIVE mg/dL
Specific Gravity, Urine: >1.046 — ABNORMAL HIGH (ref 1.005–1.030)

## 2017-09-30 LAB — RAPID URINE DRUG SCREEN, HOSP PERFORMED
AMPHETAMINES: NOT DETECTED
BARBITURATES: NOT DETECTED
BENZODIAZEPINES: NOT DETECTED
COCAINE: NOT DETECTED
Opiates: POSITIVE — AB
TETRAHYDROCANNABINOL: POSITIVE — AB

## 2017-09-30 LAB — COMPREHENSIVE METABOLIC PANEL
ALK PHOS: 79 U/L (ref 38–126)
ALT: 16 U/L (ref 0–44)
AST: 22 U/L (ref 15–41)
Albumin: 3.8 g/dL (ref 3.5–5.0)
Anion gap: 12 (ref 5–15)
BILIRUBIN TOTAL: 0.9 mg/dL (ref 0.3–1.2)
BUN: 7 mg/dL (ref 6–20)
CALCIUM: 9.2 mg/dL (ref 8.9–10.3)
CO2: 25 mmol/L (ref 22–32)
Chloride: 106 mmol/L (ref 98–111)
Creatinine, Ser: 0.78 mg/dL (ref 0.61–1.24)
GFR calc Af Amer: >60 mL/min (ref >60)
GFR calc non Af Amer: >60 mL/min (ref >60)
GLUCOSE: 137 mg/dL — AB (ref 70–99)
POTASSIUM: 3.6 mmol/L (ref 3.5–5.1)
Sodium: 143 mmol/L (ref 135–145)
TOTAL PROTEIN: 9.5 g/dL — AB (ref 6.5–8.1)

## 2017-09-30 LAB — CBC
HEMATOCRIT: 41.2 % (ref 39.0–52.0)
Hemoglobin: 13.8 g/dL (ref 13.0–17.0)
MCH: 28.5 pg (ref 26.0–34.0)
MCHC: 33.5 g/dL (ref 30.0–36.0)
MCV: 84.9 fL (ref 78.0–100.0)
Platelets: 265 10*3/uL (ref 150–400)
RBC: 4.85 MIL/uL (ref 4.22–5.81)
RDW: 16 % — AB (ref 11.5–15.5)
WBC: 11.5 10*3/uL — ABNORMAL HIGH (ref 4.0–10.5)

## 2017-09-30 LAB — LIPASE, BLOOD: Lipase: 67 U/L — ABNORMAL HIGH (ref 11–51)

## 2017-09-30 LAB — ACETAMINOPHEN LEVEL: Acetaminophen (Tylenol), Serum: <10 ug/mL — ABNORMAL LOW (ref 10–30)

## 2017-09-30 LAB — SALICYLATE LEVEL

## 2017-09-30 LAB — ETHANOL: Alcohol, Ethyl (B): <10 mg/dL (ref <10)

## 2017-09-30 MED ORDER — ONDANSETRON 4 MG PO TBDP
ORAL_TABLET | ORAL | Status: AC
  Filled 2017-09-30: qty 1

## 2017-09-30 MED ORDER — GI COCKTAIL ~~LOC~~
30.0000 mL | Freq: Once | ORAL | Status: AC
  Administered 2017-09-30: 30 mL via ORAL
  Filled 2017-09-30: qty 30

## 2017-09-30 MED ORDER — METOCLOPRAMIDE HCL 5 MG/ML IJ SOLN
10.0000 mg | Freq: Once | INTRAMUSCULAR | Status: AC
  Administered 2017-09-30: 10 mg via INTRAVENOUS
  Filled 2017-09-30: qty 2

## 2017-09-30 MED ORDER — MORPHINE SULFATE (PF) 4 MG/ML IV SOLN
6.0000 mg | Freq: Once | INTRAVENOUS | Status: AC
  Administered 2017-09-30: 6 mg via INTRAVENOUS
  Filled 2017-09-30: qty 2

## 2017-09-30 MED ORDER — ONDANSETRON 4 MG PO TBDP
4.0000 mg | ORAL_TABLET | Freq: Once | ORAL | Status: AC
  Administered 2017-09-30: 4 mg via ORAL

## 2017-09-30 MED ORDER — SODIUM CHLORIDE 0.9 % IV BOLUS
1000.0000 mL | Freq: Once | INTRAVENOUS | Status: AC
  Administered 2017-09-30: 1000 mL via INTRAVENOUS

## 2017-09-30 MED ORDER — PANTOPRAZOLE SODIUM 40 MG PO TBEC
40.0000 mg | DELAYED_RELEASE_TABLET | Freq: Every day | ORAL | Status: DC
  Administered 2017-10-01: 40 mg via ORAL
  Filled 2017-09-30: qty 1

## 2017-09-30 MED ORDER — IOPAMIDOL (ISOVUE-300) INJECTION 61%
100.0000 mL | Freq: Once | INTRAVENOUS | Status: AC | PRN
  Administered 2017-09-30: 100 mL via INTRAVENOUS

## 2017-09-30 MED ORDER — PANTOPRAZOLE SODIUM 40 MG IV SOLR
40.0000 mg | Freq: Once | INTRAVENOUS | Status: AC
  Administered 2017-09-30: 40 mg via INTRAVENOUS
  Filled 2017-09-30: qty 40

## 2017-09-30 MED ORDER — ONDANSETRON HCL 4 MG PO TABS: 8.0000 mg | ORAL_TABLET | Freq: Two times a day (BID) | ORAL | Status: DC | PRN

## 2017-09-30 MED ORDER — DICYCLOMINE HCL 10 MG PO CAPS
10.0000 mg | ORAL_CAPSULE | Freq: Three times a day (TID) | ORAL | Status: DC
  Administered 2017-09-30 – 2017-10-01 (×2): 10 mg via ORAL
  Filled 2017-09-30 (×2): qty 1

## 2017-09-30 NOTE — ED Notes (Signed)
ED Provider at bedside. 

## 2017-09-30 NOTE — ED Notes (Signed)
Father cell phone number  [phone]

## 2017-09-30 NOTE — ED Notes (Signed)
Pt states he is having stomach pain. RN made aware.

## 2017-09-30 NOTE — ED Notes (Signed)
Pt c/o abd pain, meds given for abd pain, pt states he needs pain meds, explained meds given should help and no opiate meds will be given with a hx of addiction, pt states "well what will they do if I start having DT's, can they give me suboxone?" explained to pt that we do not give suboxone but there are medications that we could give should he have DT's, pt states he was given Morphine after arrival here today and prior to that he last used Dilaudid 2 days ago

## 2017-09-30 NOTE — ED Provider Notes (Signed)
[facility] EMERGENCY DEPARTMENT Provider Note   CSN: [id number] Arrival date & time: 09/30/17  1054     History   Chief Complaint Chief Complaint  Patient presents with  . Abdominal Pain    HPI Neil Sanders is a 27 y.o. male w/ h/o PUD, GERD, chronic abdominal pain, complete heart block s/p pacemaker here for evaluation of abdominal pain. Onset 10 pm last night, worsening, constant, described as sharp and burning. Located to area around belly button.  Associated with black and yellow emesis up to 10 times, non bloody non melenotic watery diarrhea x 4, subjective fevers, chills, sweats and shortness of breath with chest pain during active forceful vomiting. Has noticed urinary frequency in the last 24 hours.  Has not been able to keep any fluids or food down, could not tolerate ODT zofran at home.  Did not eat any food yesterday and only had tea.  Has been compliant with protonix 20 mg.  Recent intentional weight loss 280 > 230# in the last 3 months with exercise and diet changes. Father reports long h/o abdominal pain but never this severe.  Has been seen by GI who did a HIDA scan and CT A/P recently that were normal. He is scheduled for EGD next week on 10/4.    Pt denies dysuria, hematuria, melena, hematochezia, exertional CP or SOB.  No ETOH use. No heavy or frequent use of NSAIDs, BC or goody powders. Occasional marijuana use, last 2 weeks ago. No other illicit drug use. No h/o kidney stones. No abd surgeries. No sick contacts.   HPI  Past Medical History:  Diagnosis Date  . Anxiety   . Asthma   . Asthma   . Back pain   . Complete heart block (HCC)    s/p epicardial PPM implant at [facility] 03/01/11  . GERD (gastroesophageal reflux disease)   . Knee pain   . Perimembranous ventricular septal defect    repaired at [facility] with patch 03/01/11  . PUD (peptic ulcer disease)   . Pulmonary hypertension [facility])     Patient Active Problem List   Diagnosis Date Noted  . Abdominal  pain, chronic, epigastric 09/18/2017  . GERD (gastroesophageal reflux disease) 02/11/2011  . Ventricular septal defect 02/09/2011  . Pulmonary hypertension (HCC) 02/09/2011  . Tobacco abuse 02/08/2011  . Marijuana abuse 02/08/2011  . Alcohol abuse 02/08/2011  . Complete heart block (HCC) 02/08/2011  . Acute blood loss anemia 02/08/2011  . Closed cervical spine fracture (HCC) 02/07/2011  . Traumatic brain injury, closed (HCC) 02/07/2011  . Pulmonary contusion 02/07/2011  . Sacral fracture, closed (HCC) 02/07/2011  . Pubic ramus fracture (HCC) 02/07/2011  . Facial contusion 02/07/2011  . Aspiration into respiratory tract 02/07/2011  . AV block, complete (HCC) 02/07/2011    Past Surgical History:  Procedure Laterality Date  . EYE SURGERY    . PACEMAKER INSERTION  03/01/11   epicardial MDT pacing system implanted at [facility]  . TONSILLECTOMY    . VSD REPAIR  03/01/11   patch repair at [facility] Medications    Prior to Admission medications   Medication Sig Start Date End Date Taking? Authorizing Provider  ibuprofen (ADVIL,MOTRIN) 200 MG tablet Take 800 mg by mouth every 6 (six) hours as needed for headache or moderate pain.   Yes [provider]  ondansetron (ZOFRAN) 8 MG tablet Take 8 mg by mouth 2 (two) times daily as needed for nausea or vomiting.  Yes [provider]  promethazine (PHENERGAN) 25 MG tablet Take 1 tablet (25 mg total) by mouth every 6 (six) hours as needed. 06/26/14  Yes Donnetta Hutching, MD  diphenoxylate-atropine (LOMOTIL) 2.5-0.025 MG per tablet Take 1 tablet by mouth 4 (four) times daily as needed for diarrhea or loose stools. Patient not taking: Reported on 09/27/2017 06/26/14   Donnetta Hutching, MD    Family History Family History  Problem Relation Age of Onset  . Sudden death Unknown        paternal great uncle and paternal great grandfather both died suddenly at home    Social History Social History   Tobacco Use  . Smoking status:  Current Every Day Smoker    Packs/day: 0.25    Years: 2.00    Pack years: 0.50    Types: Cigarettes  . Smokeless tobacco: Never Used  . Tobacco comment: 1/4 pack a day.  Substance Use Topics  . Alcohol use: No  . Drug use: Yes    Types: Marijuana    Comment: family suspicious that he may be using some pain medications. opiates. last use of marijuana 01/05/14     Allergies   Bee venom and Sulfa antibiotics   Review of Systems Review of Systems  Constitutional: Positive for chills, diaphoresis and fever.  Gastrointestinal: Positive for abdominal pain, diarrhea, nausea and vomiting.  Genitourinary: Positive for frequency.  All other systems reviewed and are negative.    Physical Exam Updated Vital Signs BP 105/80   Pulse (!) 50   Temp 97.8 F (36.6 C) (Temporal)   Resp 16   Ht 6' (1.829 m)   Wt 104.3 kg   SpO2 99%   BMI 31.19 kg/m   Physical Exam  Constitutional: He is oriented to person, place, and time. He appears well-developed and well-nourished.  Appears older than stated age. Appears uncomfortable but non toxic.   HENT:  Head: Normocephalic and atraumatic.  Nose: Nose normal.  MMM  Eyes: Pupils are equal, round, and reactive to light. Conjunctivae and EOM are normal.  Neck: Normal range of motion.  Cardiovascular: Normal rate, regular rhythm and normal heart sounds.  Sternal and left chest surgical scars noted   Pulmonary/Chest: Effort normal and breath sounds normal.  Abdominal: Soft. Bowel sounds are normal. There is tenderness in the right lower quadrant, periumbilical area and left lower quadrant.  Hyperactive BS. Diffuse lower abdominal tenderness worse at RLQ and LLQ, umbilicus. +L CVAT. No G/R/R. Negative Murphy's. No distention.   Musculoskeletal: Normal range of motion.  Neurological: He is alert and oriented to person, place, and time.  Skin: Skin is warm and dry. Capillary refill takes less than 2 seconds.  Psychiatric: He has a normal mood and  affect. His behavior is normal. Judgment and thought content normal.  Nursing note and vitals reviewed.    ED Treatments / Results  Labs (all labs ordered are listed, but only abnormal results are displayed) Labs Reviewed  LIPASE, BLOOD - Abnormal; Notable for the following components:      Result Value   Lipase 67 (*)    All other components within normal limits  COMPREHENSIVE METABOLIC PANEL - Abnormal; Notable for the following components:   Glucose, Bld 137 (*)    Total Protein 9.5 (*)    All other components within normal limits  CBC - Abnormal; Notable for the following components:   WBC 11.5 (*)    RDW 16.0 (*)    All other components within normal  limits  URINALYSIS, ROUTINE W REFLEX MICROSCOPIC - Abnormal; Notable for the following components:   Specific Gravity, Urine >1.046 (*)    Ketones, ur 5 (*)    All other components within normal limits  ACETAMINOPHEN LEVEL - Abnormal; Notable for the following components:   Acetaminophen (Tylenol), Serum <10 (*)    All other components within normal limits  RAPID URINE DRUG SCREEN, HOSP PERFORMED - Abnormal; Notable for the following components:   Opiates POSITIVE (*)    Tetrahydrocannabinol POSITIVE (*)    All other components within normal limits  ETHANOL  SALICYLATE LEVEL    EKG None  Radiology Ct Abdomen Pelvis W Contrast  Result Date: 09/30/2017 CLINICAL DATA:  Nausea vomiting and diarrhea history of peptic ulcer disease EXAM: CT ABDOMEN AND PELVIS WITH CONTRAST TECHNIQUE: Multidetector CT imaging of the abdomen and pelvis was performed using the standard protocol following bolus administration of intravenous contrast. CONTRAST:  ISOVUE-300 IOPAMIDOL (ISOVUE-300) INJECTION 61% COMPARISON:  CT 11/10/2016, 02/14/2011 FINDINGS: Lower chest: Lung bases demonstrate no focal consolidation or pleural effusion. Borderline to mild cardiomegaly with partially visualized cardiac pacing leads. Hepatobiliary: Hepatic  steatosis. No calcified gallstone or biliary dilatation. Pancreas: Unremarkable. No pancreatic ductal dilatation or surrounding inflammatory changes. Spleen: Enlarged at 16 cm.  No focal abnormality Adrenals/Urinary Tract: Adrenal glands are within normal limits. No hydronephrosis. Stable appearance of 5 mm calcification within the anterior bladder with mild adjacent wall thickening. Stomach/Bowel: The stomach is collapsed. Slightly dilated fluid-filled loops of jejunum in the left upper quadrant up to 3.2 cm with no additional small bowel dilatation. There may be slight wall thickening do a [place] jejunal loops. No surrounding inflammation. No extraluminal gas. Negative appendix.  Fluid in the colon.  No colon wall thickening. Vascular/Lymphatic: No significant vascular findings are present. No enlarged abdominal or pelvic lymph nodes. Reproductive: Prostate is unremarkable. Other: No free air or free fluid. Musculoskeletal: No acute or significant osseous findings. IMPRESSION: 1. Few borderline to slightly dilated loops of fluid-filled jejunum in the left upper quadrant with suggestion of mild wall thickening of distal duodenum/proximal jejunal small bowel loops. Findings could be secondary to duodenitis/enteritis with mild focal ileus. No evidence for high-grade bowel obstruction or free air. 2. Negative for appendicitis 3. Hepatic steatosis Electronically Signed   By: Jasmine Pang M.D.   On: 09/30/2017 14:26    Procedures Procedures (including critical care time)  Medications Ordered in ED Medications  ondansetron (ZOFRAN-ODT) disintegrating tablet 4 mg (4 mg Oral Given 09/30/17 1106)  morphine 4 MG/ML injection 6 mg (6 mg Intravenous Given 09/30/17 1221)  metoCLOPramide (REGLAN) injection 10 mg (10 mg Intravenous Given 09/30/17 1221)  sodium chloride 0.9 % bolus 1,000 mL (0 mLs Intravenous Stopped 09/30/17 1349)  pantoprazole (PROTONIX) injection 40 mg (40 mg Intravenous Given 09/30/17 1247)  iopamidol  (ISOVUE-300) 61 % injection 100 mL (100 mLs Intravenous Contrast Given 09/30/17 1345)     Initial Impression / Assessment and Plan / ED Course  I have reviewed the triage vital signs and the nursing notes.  Pertinent labs & imaging results that were available during my care of the patient were reviewed by me and considered in my medical decision making (see chart for details).  Clinical Course as of Oct 01 1630  Sat Sep 30, 2017  1218 WBC(!): 11.5 [CG]  1218 Lipase(!): 67 [CG]  1347 RN notified me pt admitted he relapsed in IV dilaudid 2.5 months ago and has been having intermittent thoughts of SI   [  CG]  1440 IMPRESSION: 1. Few borderline to slightly dilated loops of fluid-filled jejunum in the left upper quadrant with suggestion of mild wall thickening of distal duodenum/proximal jejunal small bowel loops. Findings could be secondary to duodenitis/enteritis with mild focal ileus. No evidence for high-grade bowel obstruction or free air. 2. Negative for appendicitis 3. Hepatic steatosis  CT ABDOMEN PELVIS W CONTRAST [CG]  1621 Opiates(!): POSITIVE [CG]  1621 Tetrahydrocannabinol(!): POSITIVE [CG]  1621 Ketones, ur(!): 5 [CG]    Clinical Course User Index [CG] Liberty Handy, PA-C    Reviewed pt's chart and previous GI work up. He has had recurrent n/v abdominal pain since 2013. Diagnosed with erosive and ulcerative esophagitis, gastritis and pyloric inflammation via EGD 2013.  Complete abd [place] and HIDA scan August/September 2019 normal. He is scheduled for EGD next week.  Followed by Dr Karilyn Cota at [facility]. Based on exam, highest suspicion for viral gastroenteritis vs appendicitis vs colitis.  He has no CP or SOB, perforated ulcer less likely. He is HD stable. However, does report coffee like emesis.  Reports urinary frequency and also with L CVAT. Will obtain screening labs, UA, reassess. Will consider CTAP as GI has already ordered one as outpatient. Consider GI consult.    1600: Mild leukocytosis WBC 11.5, lipase 67.  CT A/P shows mild duodenitis versus enteritis with mild focal ileus.  Patient symptoms have been significantly improved in the ER.  No further emesis or diarrhea.  He is tolerating PO. Given chronicity of symptoms, pending EGD by GI I contacted his gastroenterology practice.   GI recommend symptomatic management, small transition back to normal diet, Zofran and pain control PRN.  There is no indication for admission or other emergent intervention for today's findings on CT.  Urinalysis with minimal ketones, positive for opiates and THC.  He is medically cleared. Multiple track marks with ecchymosis noted to bilateral arms, patient was questioned about this, initially denied knowing what they were from but eventually admitted that he has relapsed on IV Dilaudid in the last 2 to 3 months.  Last use was 5 days ago.  No ETOH use.  Endorses increased depressive mood, thoughts of not wanting to be here.  Pt found his mother dead last 2022-06-04 and attempted CPR, he feels guilty that she died.  Significant stressor.   1630: TTS recommends inpatient tx, awaiting placement. Home meds reordered.  Final Clinical Impressions(s) / ED Diagnoses   Final diagnoses:  Duodenitis  Chronic abdominal pain  Nausea vomiting and diarrhea  IVDU (intravenous drug user)  Opioid abuse [facility])  Suicidal thoughts    ED Discharge Orders    None       Jerrell Mylar 09/30/17 1632    Loren Racer, MD 10/01/17 1232

## 2017-09-30 NOTE — ED Triage Notes (Addendum)
Patient c/o mid abd pain with nausea, vomiting, and diarrhea. Patient unsure of any fevers. Patient also reports urinary incontinency with vomiting. Per patient symptoms started yesterday and are progressively getting worse. Patient states unable to take any medications due to vomiting. Per patient hx of peptic ulcer disease. Per father patient's mother passed away 2022/06/09 and patient has "had a hard time with her passing." Patient has also seen DR Rehmanfor vomiting recently and has a endoscopy scheduled this Friday.

## 2017-09-30 NOTE — ED Notes (Signed)
Discussed with pt he has been IVC'ed and why, pt verbalized understanding and requests meds for anxiety and to help him sleep, pt denies taking sleep aids at home

## 2017-09-30 NOTE — ED Notes (Addendum)
Pt broke down and was tearful with this RN.  RN asked about patient feeling depressed.  Pt states he has been having a very hard time since his mom passed on 05-28-2022.  This RN asked pt if he felt like he wanted to kill himself.  Pt states " I just want to die, I don't want to live anymore.  I want to be with my mom".  Pt states he has been using drugs. This RN asked about the scarring and bruising on both of his arms.  Pt states he has been using IV Dilaudid at home and relapse two and a half months ago. Pt states "this happened when my mom was getting really sick and I just couldn't take anymore of it".   This RN explained the help we could provide for him.  Pt was accepting.   Family aware.

## 2017-09-30 NOTE — ED Notes (Signed)
Pt's father contact number [phone]
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
## 2017-10-01 MED ORDER — HYDROXYZINE HCL 25 MG PO TABS
50.0000 mg | ORAL_TABLET | Freq: Every evening | ORAL | Status: DC | PRN
  Administered 2017-10-01 (×2): 50 mg via ORAL
  Filled 2017-10-01 (×2): qty 2

## 2017-10-01 MED ORDER — HYDROXYZINE HCL 25 MG PO TABS
50.0000 mg | ORAL_TABLET | Freq: Once | ORAL | Status: AC
  Administered 2017-10-01: 50 mg via ORAL
  Filled 2017-10-01: qty 2

## 2017-10-01 NOTE — ED Notes (Signed)
TTS in progress 

## 2017-10-01 NOTE — ED Notes (Signed)
Pt requesting meds to help him sleep.

## 2017-10-01 NOTE — ED Notes (Signed)
Pt DC'd per instructions  Read and shown DC instructions, with [facility] referral in [facility] and others  Pt also reminded of appt 10/2  Father asks regarding pt diet- Pt has not had suboxone and it is suggested that he follow bland diet until seen by GI

## 2017-10-01 NOTE — Progress Notes (Signed)
Patient is seen by me via tele-psych and I have consulted with Dr. Lucianne Muss.  Patient denies any suicidal or homicidal ideations.  Patient denies any hallucinations as well.  Patient states that he was is very depressed on the day his mother that as he is taking care of her every day for the last 2 years while she was in hospice.  He said it was very devastating to him.  He now denies any thoughts of wanting to die, as he feels that he needs to be there for his dad and to live on for his mom.  His father presents in the room as it is family visiting time.  Father has no concerns with the patient discharging home and feels that patient will be safe.  Father's biggest concern is his substance abuse.  Father wants to ensure that he is getting as much treatment as possible.  Father is informed that patient needs to follow-up with outpatient services for his substance abuse.  We will request CSW to send information for patient to take home.  I patient does not meet inpatient criteria and is psychiatrically cleared.  I have contacted Dr. Juleen China and notified him of our recommendations.

## 2017-10-01 NOTE — ED Notes (Signed)
Pt report increasing anxiety and requests meds  PRN med given  Lights dimmed and pt reassurance given

## 2017-10-01 NOTE — ED Notes (Signed)
Call to [facility] to ascertain if pt will have a reeval today

## 2017-10-01 NOTE — Progress Notes (Signed)
Patient ID: Neil Sanders, male   DOB: July 22, 1990, 27 y.o.   MRN: [id number]    Spoke to charge nurse at [facility] regarding pt, she was made aware that at this time no bed was available at [facility] and that [facility] will continue to seek placement. A TTS counselor will also be reassessing pt today.

## 2017-10-01 NOTE — ED Notes (Signed)
Awaiting reassess and dispo 

## 2017-10-01 NOTE — ED Notes (Signed)
Call to [facility]- Per "our counselor and [facility], he won't be reevaluated until tomorrow"   Pt informed

## 2017-10-01 NOTE — ED Notes (Signed)
Neil Sanders Father [phone]
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
## 2017-10-01 NOTE — ED Notes (Signed)
Awaiting reeval and recommendation/dispo

## 2017-10-01 NOTE — ED Notes (Signed)
Family visit

## 2017-10-01 NOTE — ED Notes (Signed)
Pt ask when he will have another "conference call" from [facility]

## 2017-10-01 NOTE — BHH Counselor (Addendum)
Pt reassessed.  He denied SI, HI, and AVH.  Pt stated that he is no longer desiring to die.  Pt does not have any outpatient resources, but he expressed interest in finding counselor through his church.  Per Burnetta Sabin, NP, Pt may be discharged.  From assessment:  27 y.o. male who presents to [facility] ED via his father. When writer asked pt what brought you into the hospital, pt stated he had very bad vomiting and diarrhea last night. Pt states that his mother just passed away on Jun 12, 2022 and that he is not suicidal but " I just want to die". Pt denied having any plan. Pt stated I just " I just want to be with my mom". Pt states "I couldn't hurt myself, I would end up in hell". Pt appears to be inconsistent with his thoughts.  Pt denied HI/AVH. Pt acknowledges symptoms of daily crying spells, loss of interest in usual pleasures, isolation, hopelessness and increased depression since his mother has passed away. Pt denies any recent manic symptoms. Pt denies any history of intentional self-injurious behaviors. Pt denies using any drugs or alcohol, however he received detox treatment from [facility].

## 2017-10-03 NOTE — Patient Instructions (Signed)
Neil Sanders  10/03/2017     @PREFPERIOPPHARMACY @   Your procedure is scheduled on 10/06/2017.  Report to [facility] at 1100 A.M.  Call this number if you have problems the morning of surgery:  [phone]   Remember:  Do not eat or drink after midnight.      Take these medicines the morning of surgery with A SIP OF WATER Phenergan or Zofran if needed    Do not wear jewelry, make-up or nail polish.  Do not wear lotions, powders, or perfumes, or deodorant.  Do not shave 48 hours prior to surgery.  Men may shave face and neck.  Do not bring valuables to the hospital.  [facility] is not responsible for any belongings or valuables.  Contacts, dentures or bridgework may not be worn into surgery.  Leave your suitcase in the car.  After surgery it may be brought to your room.  For patients admitted to the hospital, discharge time will be determined by your treatment team.  Patients discharged the day of surgery will not be allowed to drive home.   Please read over the following fact sheets that you were given. Anesthesia Post-op Instructions     PATIENT INSTRUCTIONS POST-ANESTHESIA  IMMEDIATELY FOLLOWING SURGERY:  Do not drive or operate machinery for the first twenty four hours after surgery.  Do not make any important decisions for twenty four hours after surgery or while taking narcotic pain medications or sedatives.  If you develop intractable nausea and vomiting or a severe headache please notify your doctor immediately.  FOLLOW-UP:  Please make an appointment with your surgeon as instructed. You do not need to follow up with anesthesia unless specifically instructed to do so.  WOUND CARE INSTRUCTIONS (if applicable):  Keep a dry clean dressing on the anesthesia/puncture wound site if there is drainage.  Once the wound has quit draining you may leave it open to air.  Generally you should leave the bandage intact for twenty four hours unless there is drainage.  If the  epidural site drains for more than 36-48 hours please call the anesthesia department.  QUESTIONS?:  Please feel free to call your physician or the hospital operator if you have any questions, and they will be happy to assist you.      Esophagogastroduodenoscopy Esophagogastroduodenoscopy (EGD) is a procedure to examine the lining of the esophagus, stomach, and first part of the small intestine (duodenum). This procedure is done to check for problems such as inflammation, bleeding, ulcers, or growths. During this procedure, a long, flexible, lighted tube with a camera attached (endoscope) is inserted down the throat. Tell a health care provider about:  Any allergies you have.  All medicines you are taking, including vitamins, herbs, eye drops, creams, and over-the-counter medicines.  Any problems you or family members have had with anesthetic medicines.  Any blood disorders you have.  Any surgeries you have had.  Any medical conditions you have.  Whether you are pregnant or may be pregnant. What are the risks? Generally, this is a safe procedure. However, problems may occur, including:  Infection.  Bleeding.  A tear (perforation) in the esophagus, stomach, or duodenum.  Trouble breathing.  Excessive sweating.  Spasms of the larynx.  A slowed heartbeat.  Low blood pressure.  What happens before the procedure?  Follow instructions from your health care provider about eating or drinking restrictions.  Ask your health care provider about: ? Changing or stopping your regular medicines. This is especially  important if you are taking diabetes medicines or blood thinners. ? Taking medicines such as aspirin and ibuprofen. These medicines can thin your blood. Do not take these medicines before your procedure if your health care provider instructs you not to.  Plan to have someone take you home after the procedure.  If you wear dentures, be ready to remove them before the  procedure. What happens during the procedure?  To reduce your risk of infection, your health care team will wash or sanitize their hands.  An IV tube will be put in a vein in your hand or arm. You will get medicines and fluids through this tube.  You will be given one or more of the following: ? A medicine to help you relax (sedative). ? A medicine to numb the area (local anesthetic). This medicine may be sprayed into your throat. It will make you feel more comfortable and keep you from gagging or coughing during the procedure. ? A medicine for pain.  A mouth guard may be placed in your mouth to protect your teeth and to keep you from biting on the endoscope.  You will be asked to lie on your left side.  The endoscope will be lowered down your throat into your esophagus, stomach, and duodenum.  Air will be put into the endoscope. This will help your health care provider see better.  The lining of your esophagus, stomach, and duodenum will be examined.  Your health care provider may: ? Take a tissue sample so it can be looked at in a lab (biopsy). ? Remove growths. ? Remove objects (foreign bodies) that are stuck. ? Treat any bleeding with medicines or other devices that stop tissue from bleeding. ? Widen (dilate) or stretch narrowed areas of your esophagus and stomach.  The endoscope will be taken out. The procedure may vary among health care providers and hospitals. What happens after the procedure?  Your blood pressure, heart rate, breathing rate, and blood oxygen level will be monitored often until the medicines you were given have worn off.  Do not eat or drink anything until the numbing medicine has worn off and your gag reflex has returned. This information is not intended to replace advice given to you by your health care provider. Make sure you discuss any questions you have with your health care provider. Document Released: 04/22/2004 Document Revised: 05/28/2015  Document Reviewed: 11/13/2014 Elsevier Interactive Patient Education  [employer].

## 2017-10-04 ENCOUNTER — Other Ambulatory Visit: Payer: Self-pay

## 2017-10-04 ENCOUNTER — Encounter (HOSPITAL_COMMUNITY)
Admission: RE | Admit: 2017-10-04 | Discharge: 2017-10-04 | Disposition: A | Discharge status: Home / Self Care | Payer: BLUE CROSS/BLUE SHIELD | Source: Ambulatory Visit | Attending: Internal Medicine | Admitting: Internal Medicine

## 2017-10-04 ENCOUNTER — Encounter (HOSPITAL_COMMUNITY): Payer: Self-pay

## 2017-10-04 DIAGNOSIS — R1013 Epigastric pain: Secondary | ICD-10-CM | POA: Insufficient documentation

## 2017-10-04 DIAGNOSIS — G8929 Other chronic pain: Secondary | ICD-10-CM

## 2017-10-04 DIAGNOSIS — Z01810 Encounter for preprocedural cardiovascular examination: Secondary | ICD-10-CM | POA: Diagnosis present

## 2017-10-04 DIAGNOSIS — R9431 Abnormal electrocardiogram [ECG] [EKG]: Secondary | ICD-10-CM | POA: Diagnosis not present

## 2017-10-04 HISTORY — DX: Acute myocardial infarction, unspecified: I21.9

## 2017-10-04 HISTORY — DX: Presence of cardiac pacemaker: Z95.0

## 2017-10-06 ENCOUNTER — Ambulatory Visit (HOSPITAL_COMMUNITY): Payer: BLUE CROSS/BLUE SHIELD | Admitting: Anesthesiology

## 2017-10-06 ENCOUNTER — Encounter (HOSPITAL_COMMUNITY)
Admission: RE | Disposition: A | Discharge status: Home / Self Care | Payer: Self-pay | Source: Ambulatory Visit | Attending: Internal Medicine

## 2017-10-06 ENCOUNTER — Encounter (HOSPITAL_COMMUNITY): Payer: Self-pay

## 2017-10-06 ENCOUNTER — Ambulatory Visit (HOSPITAL_COMMUNITY)
Admission: RE | Admit: 2017-10-06 | Discharge: 2017-10-06 | Disposition: A | Discharge status: Home / Self Care | Payer: BLUE CROSS/BLUE SHIELD | Source: Ambulatory Visit | Attending: Internal Medicine | Admitting: Internal Medicine

## 2017-10-06 DIAGNOSIS — I272 Pulmonary hypertension, unspecified: Secondary | ICD-10-CM | POA: Insufficient documentation

## 2017-10-06 DIAGNOSIS — K210 Gastro-esophageal reflux disease with esophagitis: Secondary | ICD-10-CM | POA: Diagnosis not present

## 2017-10-06 DIAGNOSIS — E669 Obesity, unspecified: Secondary | ICD-10-CM | POA: Insufficient documentation

## 2017-10-06 DIAGNOSIS — F17210 Nicotine dependence, cigarettes, uncomplicated: Secondary | ICD-10-CM | POA: Insufficient documentation

## 2017-10-06 DIAGNOSIS — Z6830 Body mass index (BMI) 30.0-30.9, adult: Secondary | ICD-10-CM | POA: Diagnosis not present

## 2017-10-06 DIAGNOSIS — K209 Esophagitis, unspecified: Secondary | ICD-10-CM

## 2017-10-06 DIAGNOSIS — Z950 Presence of cardiac pacemaker: Secondary | ICD-10-CM | POA: Insufficient documentation

## 2017-10-06 DIAGNOSIS — R1013 Epigastric pain: Secondary | ICD-10-CM | POA: Diagnosis present

## 2017-10-06 DIAGNOSIS — R1011 Right upper quadrant pain: Secondary | ICD-10-CM | POA: Diagnosis not present

## 2017-10-06 DIAGNOSIS — K449 Diaphragmatic hernia without obstruction or gangrene: Secondary | ICD-10-CM | POA: Insufficient documentation

## 2017-10-06 DIAGNOSIS — K228 Other specified diseases of esophagus: Secondary | ICD-10-CM

## 2017-10-06 DIAGNOSIS — I442 Atrioventricular block, complete: Secondary | ICD-10-CM | POA: Diagnosis not present

## 2017-10-06 DIAGNOSIS — R112 Nausea with vomiting, unspecified: Secondary | ICD-10-CM | POA: Diagnosis not present

## 2017-10-06 DIAGNOSIS — G8929 Other chronic pain: Secondary | ICD-10-CM

## 2017-10-06 DIAGNOSIS — K2970 Gastritis, unspecified, without bleeding: Secondary | ICD-10-CM

## 2017-10-06 DIAGNOSIS — K2960 Other gastritis without bleeding: Secondary | ICD-10-CM

## 2017-10-06 DIAGNOSIS — Z8711 Personal history of peptic ulcer disease: Secondary | ICD-10-CM | POA: Diagnosis not present

## 2017-10-06 DIAGNOSIS — I252 Old myocardial infarction: Secondary | ICD-10-CM | POA: Insufficient documentation

## 2017-10-06 HISTORY — PX: ESOPHAGOGASTRODUODENOSCOPY (EGD) WITH PROPOFOL: SHX5813

## 2017-10-06 SURGERY — ESOPHAGOGASTRODUODENOSCOPY (EGD) WITH PROPOFOL
Anesthesia: Monitor Anesthesia Care

## 2017-10-06 MED ORDER — HYDROMORPHONE HCL 1 MG/ML IJ SOLN: 0.2500 mg | INTRAMUSCULAR | Status: DC | PRN

## 2017-10-06 MED ORDER — LACTATED RINGERS IV SOLN
INTRAVENOUS | Status: DC
  Administered 2017-10-06: 12:00:00 via INTRAVENOUS

## 2017-10-06 MED ORDER — PROPOFOL 10 MG/ML IV BOLUS
INTRAVENOUS | Status: DC | PRN
  Administered 2017-10-06: 100 mg via INTRAVENOUS
  Administered 2017-10-06: 50 mg via INTRAVENOUS

## 2017-10-06 MED ORDER — LACTATED RINGERS IV SOLN: INTRAVENOUS | Status: DC

## 2017-10-06 MED ORDER — PROPOFOL 10 MG/ML IV BOLUS
INTRAVENOUS | Status: AC
  Filled 2017-10-06: qty 40

## 2017-10-06 MED ORDER — MIDAZOLAM HCL 2 MG/2ML IJ SOLN
INTRAMUSCULAR | Status: AC
  Filled 2017-10-06: qty 2

## 2017-10-06 MED ORDER — CHLORHEXIDINE GLUCONATE CLOTH 2 % EX PADS: 6.0000 | MEDICATED_PAD | Freq: Once | CUTANEOUS | Status: DC

## 2017-10-06 MED ORDER — MIDAZOLAM HCL 5 MG/5ML IJ SOLN
INTRAMUSCULAR | Status: DC | PRN
  Administered 2017-10-06: 2 mg via INTRAVENOUS

## 2017-10-06 MED ORDER — HYDROCODONE-ACETAMINOPHEN 7.5-325 MG PO TABS: 1.0000 | ORAL_TABLET | Freq: Once | ORAL | Status: DC | PRN

## 2017-10-06 MED ORDER — LIDOCAINE HCL (CARDIAC) PF 100 MG/5ML IV SOSY
PREFILLED_SYRINGE | INTRAVENOUS | Status: DC | PRN
  Administered 2017-10-06: 50 mg via INTRAVENOUS

## 2017-10-06 MED ORDER — MEPERIDINE HCL 100 MG/ML IJ SOLN: 6.2500 mg | INTRAMUSCULAR | Status: DC | PRN

## 2017-10-06 MED ORDER — PANTOPRAZOLE SODIUM 40 MG PO TBEC: 40.0000 mg | DELAYED_RELEASE_TABLET | Freq: Two times a day (BID) | ORAL | 5 refills | Status: DC

## 2017-10-06 MED ORDER — PROMETHAZINE HCL 25 MG/ML IJ SOLN: 6.2500 mg | INTRAMUSCULAR | Status: DC | PRN

## 2017-10-06 NOTE — Anesthesia Postprocedure Evaluation (Signed)
Anesthesia Post Note  Patient: Neil Sanders  Procedure(s) Performed: ESOPHAGOGASTRODUODENOSCOPY (EGD) WITH PROPOFOL (N/A )  Patient location during evaluation: PACU Anesthesia Type: MAC Level of consciousness: awake, oriented and awake and alert Pain management: pain level controlled Vital Signs Assessment: post-procedure vital signs reviewed and stable Respiratory status: spontaneous breathing, nonlabored ventilation, respiratory function stable and patient connected to nasal cannula oxygen Cardiovascular status: stable Postop Assessment: no apparent nausea or vomiting Anesthetic complications: no     Last Vitals:  Vitals:   10/06/17 1111  BP: 122/77  Pulse: 66  Resp: 16  Temp: 36.6 C  SpO2: 96%    Last Pain:  Vitals:   10/06/17 1303  TempSrc:   PainSc: 0-No pain                 Mischele Detter

## 2017-10-06 NOTE — Anesthesia Preprocedure Evaluation (Signed)
Anesthesia Evaluation  Patient identified by MRN, date of birth, ID band Patient awake    Reviewed: Allergy & Precautions, H&P , NPO status , Patient's Chart, lab work & pertinent test results, reviewed documented beta blocker date and time   Airway Mallampati: II  TM Distance: >3 FB Neck ROM: full    Dental no notable dental hx. (+) Teeth Intact, Poor Dentition, Dental Advidsory Given   Pulmonary neg pulmonary ROS, Current Smoker,    Pulmonary exam normal breath sounds clear to auscultation       Cardiovascular Exercise Tolerance: Good + Past MI  negative cardio ROS  + dysrhythmias + pacemaker  Rhythm:regular Rate:Normal     Neuro/Psych Anxiety negative neurological ROS  negative psych ROS   GI/Hepatic negative GI ROS, Neg liver ROS, PUD, GERD  ,  Endo/Other  negative endocrine ROS  Renal/GU negative Renal ROS  negative genitourinary   Musculoskeletal   Abdominal   Peds  Hematology negative hematology ROS (+) anemia ,   Anesthesia Other Findings Paced MI s/p corrective repair of VSD  Obese   Reproductive/Obstetrics negative OB ROS                             Anesthesia Physical Anesthesia Plan  ASA: III  Anesthesia Plan: MAC   Post-op Pain Management:    Induction:   PONV Risk Score and Plan:   Airway Management Planned:   Additional Equipment:   Intra-op Plan:   Post-operative Plan:   Informed Consent: I have reviewed the patients History and Physical, chart, labs and discussed the procedure including the risks, benefits and alternatives for the proposed anesthesia with the patient or authorized representative who has indicated his/her understanding and acceptance.   Dental Advisory Given  Plan Discussed with: CRNA and Anesthesiologist  Anesthesia Plan Comments:         Anesthesia Quick Evaluation

## 2017-10-06 NOTE — Transfer of Care (Signed)
Immediate Anesthesia Transfer of Care Note  Patient: Neil Sanders  Procedure(s) Performed: ESOPHAGOGASTRODUODENOSCOPY (EGD) WITH PROPOFOL (N/A )  Patient Location: PACU  Anesthesia Type:MAC  Level of Consciousness: awake, alert  and oriented  Airway & Oxygen Therapy: Patient Spontanous Breathing and Patient connected to nasal cannula oxygen  Post-op Assessment: Report given to RN and Post -op Vital signs reviewed and stable  Post vital signs: Reviewed and stable  Last Vitals:  Vitals Value Taken Time  BP    Temp    Pulse    Resp    SpO2      Last Pain:  Vitals:   10/06/17 1303  TempSrc:   PainSc: 0-No pain      Patients Stated Pain Goal: 7 (05/13/00 1117)  Complications: No apparent anesthesia complications

## 2017-10-06 NOTE — Op Note (Signed)
[facility] Patient Name: Neil Sanders Procedure Date: 10/06/2017 12:37 PM MRN: [id number] Date of Birth: 1990/06/23 Attending MD: Lionel December , MD CSN: [id number] Age: 27 Admit Type: Outpatient Procedure:                Upper GI endoscopy Indications:              Epigastric abdominal pain, Abdominal pain in the                            right upper quadrant, Nausea with vomiting Providers:                Lionel December, MD, Loma Messing B. Patsy Lager, RN, Dyann Ruddle Referring MD:             Kirstie Peri, MD Medicines:                Propofol per Anesthesia Complications:            No immediate complications. Estimated Blood Loss:     Estimated blood loss: none. Procedure:                Pre-Anesthesia Assessment:                           - Prior to the procedure, a History and Physical                            was performed, and patient medications and                            allergies were reviewed. The patient's tolerance of                            previous anesthesia was also reviewed. The risks                            and benefits of the procedure and the sedation                            options and risks were discussed with the patient.                            All questions were answered, and informed consent                            was obtained. Prior Anticoagulants: The patient                            last took ibuprofen 7 days prior to the procedure.                            ASA Grade Assessment: III - A patient with severe  systemic disease. After reviewing the risks and                            benefits, the patient was deemed in satisfactory                            condition to undergo the procedure.                           After obtaining informed consent, the endoscope was                            passed under direct vision. Throughout the                            procedure,  the patient's blood pressure, pulse, and                            oxygen saturations were monitored continuously. The                            [id number]-[id number] ([id number]) was introduced through the and                            advanced to the second part of duodenum. The upper                            GI endoscopy was accomplished with ease. The                            patient tolerated the procedure well. Scope In: 1:07:39 PM Scope Out: 1:14:09 PM Total Procedure Duration: 0 hours 6 minutes 30 seconds  Findings:      The proximal esophagus and mid esophagus were normal.      LA Grade C (one or more mucosal breaks continuous between tops of 2 or       more mucosal folds, less than 75% circumference) esophagitis was found       35 to 37 cm from the incisors.      The Z-line was irregular and was found 37 cm from the incisors.      A 3 cm hiatal hernia was present.      Localized minimal inflammation characterized by erosions and erythema       was found in the gastric antrum and in the prepyloric region of the       stomach. focalerythema at fundus.      The exam of the stomach was otherwise normal.      The duodenal bulb, second portion of the duodenum and major papilla were       normal. Impression:               - Normal proximal esophagus and mid esophagus.                           - LA Grade C esophagitis.                           -  Z-line irregular, 37 cm from the incisors.                           - 3 cm hiatal hernia.                           - Gastritis.                           - Normal duodenal bulb, second portion of the                            duodenum and major papilla.                           - No specimens collected. Moderate Sedation:      Per Anesthesia Care Recommendation:           - Patient has a contact number available for                            emergencies. The signs and symptoms of potential                            delayed complications  were discussed with the                            patient. Return to normal activities tomorrow.                            Written discharge instructions were provided to the                            patient.                           - Resume previous diet today.                           - Continue present medications.                           - Perform an H. pylori serology today.                           - OV in 2 months. Procedure Code(s):        --- Professional ---                           [phone], Esophagogastroduodenoscopy, flexible,                            transoral; diagnostic, including collection of                            specimen(s) by brushing or washing, when performed                            (  separate procedure) Diagnosis Code(s):        --- Professional ---                           K20.9, Esophagitis, unspecified                           K22.8, Other specified diseases of esophagus                           K44.9, Diaphragmatic hernia without obstruction or                            gangrene                           K29.70, Gastritis, unspecified, without bleeding                           R10.13, Epigastric pain                           R10.11, Right upper quadrant pain                           R11.2, Nausea with vomiting, unspecified CPT copyright 2017 American Medical Association. All rights reserved. The codes documented in this report are preliminary and upon coder review may  be revised to meet current compliance requirements. Lionel December, MD Lionel December, MD 10/06/2017 1:25:29 PM This report has been signed electronically. Number of Addenda: 0

## 2017-10-06 NOTE — Progress Notes (Signed)
"  I am ready to go, I either want a [employer] burger or [employer]".

## 2017-10-06 NOTE — Discharge Instructions (Signed)
Keep Ibuprofen use to minimum. Pantoprazole 40 mg by mouth 30 minutes before breakfast and evening meal daily. Resume other medications as before. Resume usual diet. Antireflux measures measures. No driving for 24 hours. Physician will call with results of blood test.      Esophagogastroduodenoscopy, Care After Refer to this sheet in the next few weeks. These instructions provide you with information about caring for yourself after your procedure. Your health care provider may also give you more specific instructions. Your treatment has been planned according to current medical practices, but problems sometimes occur. Call your health care provider if you have any problems or questions after your procedure. What can I expect after the procedure? After the procedure, it is common to have:  A sore throat.  Nausea.  Bloating.  Dizziness.  Fatigue.  Follow these instructions at home:  Do not eat or drink anything until the numbing medicine (local anesthetic) has worn off and your gag reflex has returned. You will know that the local anesthetic has worn off when you can swallow comfortably.  Do not drive for 24 hours if you received a medicine to help you relax (sedative).  If your health care provider took a tissue sample for testing during the procedure, make sure to get your test results. This is your responsibility. Ask your health care provider or the department performing the test when your results will be ready.  Keep all follow-up visits as told by your health care provider. This is important. Contact a health care provider if:  You cannot stop coughing.  You are not urinating.  You are urinating less than usual. Get help right away if:  You have trouble swallowing.  You cannot eat or drink.  You have throat or chest pain that gets worse.  You are dizzy or light-headed.  You faint.  You have nausea or vomiting.  You have chills.  You have a fever.  You  have severe abdominal pain.  You have black, tarry, or bloody stools. This information is not intended to replace advice given to you by your health care provider. Make sure you discuss any questions you have with your health care provider. Document Released: 12/07/2011 Document Revised: 05/28/2015 Document Reviewed: 11/13/2014 Elsevier Interactive Patient Education  2018 [employer].     Gastroesophageal Reflux Disease, Adult Normally, food travels down the esophagus and stays in the stomach to be digested. If a person has gastroesophageal reflux disease (GERD), food and stomach acid move back up into the esophagus. When this happens, the esophagus becomes sore and swollen (inflamed). Over time, GERD can make small holes (ulcers) in the lining of the esophagus. Follow these instructions at home: Diet  Follow a diet as told by your doctor. You may need to avoid foods and drinks such as: ? Coffee and tea (with or without caffeine). ? Drinks that contain alcohol. ? Energy drinks and sports drinks. ? Carbonated drinks or sodas. ? Chocolate and cocoa. ? Peppermint and mint flavorings. ? Garlic and onions. ? Horseradish. ? Spicy and acidic foods, such as peppers, chili powder, curry powder, vinegar, hot sauces, and BBQ sauce. ? Citrus fruit juices and citrus fruits, such as oranges, lemons, and limes. ? Tomato-based foods, such as red sauce, chili, salsa, and pizza with red sauce. ? Fried and fatty foods, such as donuts, french fries, potato chips, and high-fat dressings. ? High-fat meats, such as hot dogs, rib eye steak, sausage, ham, and bacon. ? High-fat dairy items, such as whole  milk, butter, and cream cheese.  Eat small meals often. Avoid eating large meals.  Avoid drinking large amounts of liquid with your meals.  Avoid eating meals during the 2-3 hours before bedtime.  Avoid lying down right after you eat.  Do not exercise right after you eat. General instructions  Pay  attention to any changes in your symptoms.  Take over-the-counter and prescription medicines only as told by your doctor. Do not take aspirin, ibuprofen, or other NSAIDs unless your doctor says it is okay.  Do not use any tobacco products, including cigarettes, chewing tobacco, and e-cigarettes. If you need help quitting, ask your doctor.  Wear loose clothes. Do not wear anything tight around your waist.  Raise (elevate) the head of your bed about 6 inches (15 cm).  Try to lower your stress. If you need help doing this, ask your doctor.  If you are overweight, lose an amount of weight that is healthy for you. Ask your doctor about a safe weight loss goal.  Keep all follow-up visits as told by your doctor. This is important. Contact a doctor if:  You have new symptoms.  You lose weight and you do not know why it is happening.  You have trouble swallowing, or it hurts to swallow.  You have wheezing or a cough that keeps happening.  Your symptoms do not get better with treatment.  You have a hoarse voice. Get help right away if:  You have pain in your arms, neck, jaw, teeth, or back.  You feel sweaty, dizzy, or light-headed.  You have chest pain or shortness of breath.  You throw up (vomit) and your throw up looks like blood or coffee grounds.  You pass out (faint).  Your poop (stool) is bloody or black.  You cannot swallow, drink, or eat. This information is not intended to replace advice given to you by your health care provider. Make sure you discuss any questions you have with your health care provider. Document Released: 06/08/2007 Document Revised: 05/28/2015 Document Reviewed: 04/16/2014 Elsevier Interactive Patient Education  2018 Elsevier Inc.     Monitored Anesthesia Care, Care After These instructions provide you with information about caring for yourself after your procedure. Your health care provider may also give you more specific instructions. Your  treatment has been planned according to current medical practices, but problems sometimes occur. Call your health care provider if you have any problems or questions after your procedure. What can I expect after the procedure? After your procedure, it is common to:  Feel sleepy for several hours.  Feel clumsy and have poor balance for several hours.  Feel forgetful about what happened after the procedure.  Have poor judgment for several hours.  Feel nauseous or vomit.  Have a sore throat if you had a breathing tube during the procedure.  Follow these instructions at home: For at least 24 hours after the procedure:   Do not: ? Participate in activities in which you could fall or become injured. ? Drive. ? Use heavy machinery. ? Drink alcohol. ? Take sleeping pills or medicines that cause drowsiness. ? Make important decisions or sign legal documents. ? Take care of children on your own.  Rest. Eating and drinking  Follow the diet that is recommended by your health care provider.  If you vomit, drink water, juice, or soup when you can drink without vomiting.  Make sure you have little or no nausea before eating solid foods. General instructions  Have a  responsible adult stay with you until you are awake and alert.  Take over-the-counter and prescription medicines only as told by your health care provider.  If you smoke, do not smoke without supervision.  Keep all follow-up visits as told by your health care provider. This is important. Contact a health care provider if:  You keep feeling nauseous or you keep vomiting.  You feel light-headed.  You develop a rash.  You have a fever. Get help right away if:  You have trouble breathing. This information is not intended to replace advice given to you by your health care provider. Make sure you discuss any questions you have with your health care provider. Document Released: 04/12/2015 Document Revised: 08/12/2015  Document Reviewed: 04/12/2015 Elsevier Interactive Patient Education  [employer].

## 2017-10-06 NOTE — H&P (Signed)
Neil Sanders is an 27 y.o. male.   Chief Complaint: Patient is here for EGD. HPI: Patient is 27 year old Caucasian male with complicated medical history who has chronic GERD presents with 3 months history of intermittent nausea and vomiting.  He also complains of epigastric and right upper quadrant abdominal pain.  He was recently hospitalized.  He states he has not had any vomiting spells since then.  He generally vomits fluid which he believes is acid.  He has not vomited food or blood.  Denies melena or frank rectal bleeding.  At times he notices small amount of blood in the tissue.  He has lost 20 pounds voluntarily.  He has been under a lot of stress.  He lost his mother about 2 weeks ago.  He had been caring for her but she passed away.  Recent work-up included ultrasound and HIDA scan and the studies were unremarkable.  Abdominopelvic CT showed fatty liver and prominent jejunal loops.  Past Medical History:  Diagnosis Date  . Anxiety   . Asthma   . Asthma   . Back pain   . Complete heart block (HCC)    s/p epicardial PPM implant at [facility] 03/01/11  . GERD (gastroesophageal reflux disease)   . Knee pain   . Myocardial infarction (HCC) 2013  . Perimembranous ventricular septal defect    repaired at [facility] with patch 03/01/11  . Presence of permanent cardiac pacemaker   . PUD (peptic ulcer disease)   . Pulmonary hypertension (HCC)     Past Surgical History:  Procedure Laterality Date  . EYE SURGERY Right    cyst removed from eyebrow  . PACEMAKER INSERTION  03/01/2011   epicardial MDT pacing system implanted at [facility] x2  . TONSILLECTOMY    . VSD REPAIR  03/01/11   patch repair at [facility] History  Problem Relation Age of Onset  . Sudden death Unknown        paternal great uncle and paternal great grandfather both died suddenly at home   Social History:  reports that he has been smoking cigarettes. He has a 0.50 pack-year smoking history. He has never used smokeless  tobacco. He reports that he has current or past drug history. Drug: Marijuana. He reports that he does not drink alcohol.  Allergies:  Allergies  Allergen Reactions  . Bee Venom Swelling and Rash  . Sulfa Antibiotics Swelling and Other (See Comments)    Throat swelling     Medications Prior to Admission  Medication Sig Dispense Refill  . buprenorphine-naloxone (SUBOXONE) 8-2 mg SUBL SL tablet Place 1 tablet under the tongue 2 (two) times daily.    [name] ibuprofen (ADVIL,MOTRIN) 200 MG tablet Take 800 mg by mouth every 6 (six) hours as needed for headache or moderate pain.    . mirtazapine (REMERON SOL-TAB) 15 MG disintegrating tablet Take 15 mg by mouth at bedtime.    . ondansetron (ZOFRAN) 8 MG tablet Take 8 mg by mouth 2 (two) times daily as needed for nausea or vomiting.    . diphenoxylate-atropine (LOMOTIL) 2.5-0.025 MG per tablet Take 1 tablet by mouth 4 (four) times daily as needed for diarrhea or loose stools. (Patient not taking: Reported on 09/27/2017) 20 tablet 0  . promethazine (PHENERGAN) 25 MG tablet Take 1 tablet (25 mg total) by mouth every 6 (six) hours as needed. 15 tablet 0    No results found for this or any previous visit (from the past 48 hour(s)). No  results found.  ROS  Blood pressure 122/77, pulse 66, temperature 97.8 F (36.6 C), temperature source Oral, resp. rate 16, SpO2 96 %. Physical Exam  Constitutional:  Well-developed obese Caucasian male in NAD.  Eyes: Conjunctivae are normal. No scleral icterus.  Neck:  Small scar above the medial end of right clavicle.  No thyromegaly or lymphadenopathy.  Cardiovascular: Normal rate, regular rhythm and normal heart sounds.  No murmur heard. Respiratory: Effort normal and breath sounds normal.  Midsternal scar.  GI:  Abdomen is full.  He has pacemaker below the left costal margin(external pacemaker).  Abdomen is soft.  He has mild tenderness in mid epigastrium and below the right costal margin.  No masses  organomegaly.  Musculoskeletal: He exhibits no edema.  Neurological: He is alert.  Skin: Skin is warm and dry.     Assessment/Plan Nausea vomiting and epigastric/right upper quadrant abdominal pain. Diagnostic EGD.  Lionel December, MD 10/06/2017, 12:56 PM

## 2017-10-09 LAB — H. PYLORI ANTIBODY, IGG: H PYLORI IGG: 0.58 {index_val} (ref 0.00–0.79)

## 2017-10-11 ENCOUNTER — Encounter (HOSPITAL_COMMUNITY): Payer: Self-pay | Admitting: Internal Medicine

## 2017-12-11 ENCOUNTER — Ambulatory Visit (INDEPENDENT_AMBULATORY_CARE_PROVIDER_SITE_OTHER): Payer: BLUE CROSS/BLUE SHIELD | Admitting: Internal Medicine

## 2017-12-12 ENCOUNTER — Ambulatory Visit (INDEPENDENT_AMBULATORY_CARE_PROVIDER_SITE_OTHER): Payer: BLUE CROSS/BLUE SHIELD | Admitting: Internal Medicine

## 2017-12-14 ENCOUNTER — Ambulatory Visit (INDEPENDENT_AMBULATORY_CARE_PROVIDER_SITE_OTHER): Payer: BLUE CROSS/BLUE SHIELD | Admitting: Internal Medicine

## 2019-09-16 ENCOUNTER — Ambulatory Visit (INDEPENDENT_AMBULATORY_CARE_PROVIDER_SITE_OTHER): Payer: BLUE CROSS/BLUE SHIELD | Admitting: Gastroenterology

## 2019-09-19 ENCOUNTER — Ambulatory Visit (INDEPENDENT_AMBULATORY_CARE_PROVIDER_SITE_OTHER): Payer: PRIVATE HEALTH INSURANCE | Admitting: Gastroenterology

## 2019-09-19 ENCOUNTER — Encounter (INDEPENDENT_AMBULATORY_CARE_PROVIDER_SITE_OTHER): Payer: Self-pay | Admitting: Gastroenterology

## 2019-09-19 ENCOUNTER — Other Ambulatory Visit: Payer: Self-pay

## 2019-09-19 VITALS — BP 113/82 | HR 76 | Temp 97.7°F | Ht 72.0 in | Wt 313.0 lb

## 2019-09-19 DIAGNOSIS — R110 Nausea: Secondary | ICD-10-CM

## 2019-09-19 DIAGNOSIS — K625 Hemorrhage of anus and rectum: Secondary | ICD-10-CM | POA: Diagnosis not present

## 2019-09-19 DIAGNOSIS — K219 Gastro-esophageal reflux disease without esophagitis: Secondary | ICD-10-CM

## 2019-09-19 DIAGNOSIS — K644 Residual hemorrhoidal skin tags: Secondary | ICD-10-CM

## 2019-09-19 MED ORDER — PANTOPRAZOLE SODIUM 40 MG PO TBEC: 40.0000 mg | DELAYED_RELEASE_TABLET | Freq: Every day | ORAL | 1 refills | Status: DC

## 2019-09-19 MED ORDER — ONDANSETRON HCL 8 MG PO TABS: 8.0000 mg | ORAL_TABLET | Freq: Two times a day (BID) | ORAL | 1 refills | Status: DC | PRN

## 2019-09-19 NOTE — Progress Notes (Signed)
Katrinka Blazing, M.D. Gastroenterology & Hepatology [facility] [address] [place], [place] [address]  Primary Care Physician: Kirstie Peri, MD [address] [place] [address]  I will communicate my assessment and recommendations to the referring MD via EMR. "Note: Occasional unusual wording and randomly placed punctuation marks may result from the use of speech recognition technology to transcribe this document"  Problems: 1. Rectal bleeding 2. GERD 3. Intermittent nausea  History of Present Illness: Neil Sanders is a 29 y.o. male with past medical history of obesity, GERD, asthma, complete heart block status post pacemaker placement, opiate addiction currently on Suboxone and pulmonary hypertension, who presents for evaluation of rectal bleeding and dyschezia.  The patient was last seen on 08/16/2017. At that time, the patient was started on Protonix twice daily for management of heartburn episodes.  He was also given Zofran and Phenergan prescription for nausea before his meals.  Due to recurrent symptoms, the patient underwent an EGD on 10/06/2017 which showed grade C esophagitis and a 3 cm hiatal hernia, there was also presence of gastritis.  No biopsies were taken.H. pylori testing was performed which was negative.  The patient did not follow-up in the clinic afterwards.  He reports that he has been taking his pantoprazole compliantly twice a day since then and denies having any heartburn, odynophagia or dysphagia.  He needs refills for these medications.  The patient stated that he has been concerned as for 1 week he has noticed episodes of persistent rectal bleeding both when he wipes and when he has a bowel movement.  He also endorses having some dyschezia with each bowel movement.  He has tried Preparation H once times a day, this has helped improving his pain but not with his bleeding.  He has never had similar symptoms in the  past.  He also reports having history of constipation for many years, as he has had to strain significantly to have at least 1 bowel movement a day.  States that his stool is hard.  Due to this, he has started a stool softener a couple days ago.  He has also noticed his stool frequency has decreased to 1 bowel movement every other day in the last week.  He also feels having some epigastric discomfort, he has nothing is a pain but is present when he presses his belly.  The patient denies having any nausea, vomiting, fever, chills, hematochezia, melena, hematemesis, abdominal distention, abdominal pain, diarrhea, jaundice, pruritus or weight loss.  Last Colonoscopy: never  FHx: neg for any gastrointestinal/liver disease, father had history of liver cancer due to HCV and alcohol intake Social: neg smoking, alcohol.  States he used to have opiate addiction currently on Suboxone  Past Medical History: Past Medical History:  Diagnosis Date  . Anxiety   . Asthma   . Asthma   . Back pain   . Complete heart block (HCC)    s/p epicardial PPM implant at [facility] 03/01/11  . GERD (gastroesophageal reflux disease)   . Knee pain   . Myocardial infarction (HCC) 2013  . Perimembranous ventricular septal defect    repaired at [facility] with patch 03/01/11  . Presence of permanent cardiac pacemaker   . PUD (peptic ulcer disease)   . Pulmonary hypertension (HCC)     Past Surgical History: Past Surgical History:  Procedure Laterality Date  . ESOPHAGOGASTRODUODENOSCOPY (EGD) WITH PROPOFOL N/A 10/06/2017   Procedure: ESOPHAGOGASTRODUODENOSCOPY (EGD) WITH PROPOFOL;  Surgeon: Lionel December  U, MD;  Location: [facility];  Service: Endoscopy;  Laterality: N/A;  12:30  . EYE SURGERY Right    cyst removed from eyebrow  . PACEMAKER INSERTION  03/01/2011   epicardial MDT pacing system implanted at [facility] x2  . TONSILLECTOMY    . VSD REPAIR  03/01/11   patch repair at [facility]    Family History: Family History  Problem  Relation Age of Onset  . Sudden death Other        paternal great uncle and paternal great grandfather both died suddenly at home    Social History: Social History   Tobacco Use  Smoking Status Current Every Day Smoker  . Packs/day: 0.25  . Years: 2.00  . Pack years: 0.50  . Types: Cigarettes  Smokeless Tobacco Never Used  Tobacco Comment   1/4 pack a day.   Social History   Substance and Sexual Activity  Alcohol Use No   Social History   Substance and Sexual Activity  Drug Use Yes  . Types: Marijuana   Comment: family suspicious that he may be using some pain medications. opiates. last use of marijuana 01/05/14    Allergies: Allergies  Allergen Reactions  . Bee Venom Swelling and Rash  . Sulfa Antibiotics Swelling and Other (See Comments)    Throat swelling     Medications: Current Outpatient Medications  Medication Sig Dispense Refill  . buprenorphine-naloxone (SUBOXONE) 8-2 mg SUBL SL tablet Place 1 tablet under the tongue 2 (two) times daily.    [name] ibuprofen (ADVIL,MOTRIN) 200 MG tablet Take 800 mg by mouth every 6 (six) hours as needed for headache or moderate pain.    [name] ondansetron (ZOFRAN) 8 MG tablet Take 8 mg by mouth 2 (two) times daily as needed for nausea or vomiting.    . pantoprazole (PROTONIX) 40 MG tablet Take 1 tablet (40 mg total) by mouth 2 (two) times daily before a meal. 60 tablet 5  . QUEtiapine (SEROQUEL) 100 MG tablet Take 100 mg by mouth at bedtime.    . mirtazapine (REMERON SOL-TAB) 15 MG disintegrating tablet Take 15 mg by mouth at bedtime. (Patient not taking: Reported on 09/19/2019)    . promethazine (PHENERGAN) 25 MG tablet Take 1 tablet (25 mg total) by mouth every 6 (six) hours as needed. (Patient not taking: Reported on 09/19/2019) 15 tablet 0   No current facility-administered medications for this visit.    Review of Systems: GENERAL: negative for malaise, night sweats HEENT: No changes in hearing or vision, no nose bleeds or other  nasal problems. NECK: Negative for lumps, goiter, pain and significant neck swelling RESPIRATORY: Negative for cough, wheezing CARDIOVASCULAR: Negative for chest pain, leg swelling, palpitations, orthopnea GI: SEE HPI MUSCULOSKELETAL: Negative for joint pain or swelling, back pain, and muscle pain. SKIN: Negative for lesions, rash PSYCH: Negative for sleep disturbance, mood disorder and recent psychosocial stressors. HEMATOLOGY Negative for prolonged bleeding, bruising easily, and swollen nodes. ENDOCRINE: Negative for cold or heat intolerance, polyuria, polydipsia and goiter. NEURO: negative for tremor, gait imbalance, syncope and seizures. The remainder of the review of systems is noncontributory.   Physical Exam: BP 113/82 (BP Location: Right Arm, Patient Position: Sitting, Cuff Size: Normal)   Pulse 76   Temp 97.7 F (36.5 C) (Oral)   Ht 6' (1.829 m)   Wt (!) 313 lb (142 kg)   BMI 42.45 kg/m  GENERAL: The patient is AO x3, in no acute distress. Obese. HEENT: Head is normocephalic and atraumatic.  EOMI are intact. Mouth is well hydrated and without lesions. NECK: Supple. No masses LUNGS: Clear to auscultation. No presence of rhonchi/wheezing/rales. Adequate chest expansion HEART: RRR, normal s1 and s2. ABDOMEN: Soft, nontender, no guarding, no peritoneal signs, and nondistended. BS +. Presence of a reducible epigastric hernia, mildly tender to palpation. RECTAL: Presence of small nonthrombosed nonbleeding external hemorrhoids located at 12 position which are nontender, rectal tone is normal, no presence of fresh blood in the rectal vault or masses.  Chaperone was [employer]. EXTREMITIES: Without any cyanosis, clubbing, rash, lesions or edema. NEUROLOGIC: AOx3, no focal motor deficit. SKIN: no jaundice, no rashes  Imaging/Labs: as above  I personally reviewed and interpreted the available labs, imaging and endoscopic files.  Impression and Plan: Neil Sanders is a  29 y.o. male with past medical history of obesity, GERD, asthma, complete heart block status post pacemaker placement, opiate addiction currently on Suboxone and pulmonary hypertension, who presents for evaluation of rectal bleeding and dyschezia.  The patient has presented recent onset of rectal bleeding with dyschezia episodes, which along with the findings on physical exam are consistent with hemorrhoidal bleeding.  The patient was counseled about the importance of having looser bowel movements while using topical ointment to decrease his symptoms.  Due to this, patient should continue compliantly taking his Preparation H and will benefit of starting MiraLAX intake.  If his symptoms persist after 2 months, we will need to perform a endoscopic investigation to rule out any other source of persistent lower gastrointestinal bleeding and possibly refer him to colorectal surgery for further management.  On the other hand, his GERD has been well controlled with his pantoprazole, he will be given a prescription to take it once a day.  Also, not had one well-controlled so far which I will refill.  - Continue using preparation H at least twice a day - Start taking Miralax 1 cap every day for one week. If bowel movements do not soften, increase to 1 cup every 12 hours. If after two weeks there is no improvement, increase to 1 cup every 8 hours -If no improvement after 8 weeks, will need to perform a colonoscopy and possibly refer him to colorectal surgery -Pantoprazole 40 mg every day -Zofran 8 mg twice daily as needed  All questions were answered.      Dolores Frame, MD Gastroenterology and Hepatology [facility]

## 2019-09-19 NOTE — Patient Instructions (Addendum)
Continue using preparation H at least twice a day Start taking Miralax 1 cap every day for one week. If bowel movements do not soften, increase to 1 cup every 12 hours. If after two weeks there is no improvement, increase to 1 cup every 8 hours Call back if no improvement is achieved after 4 weeks of treatment, will need to make follow up appointment for 2 months from now if so

## 2020-01-16 DIAGNOSIS — R69 Illness, unspecified: Secondary | ICD-10-CM | POA: Diagnosis not present

## 2020-02-05 DIAGNOSIS — R69 Illness, unspecified: Secondary | ICD-10-CM | POA: Diagnosis not present

## 2020-02-05 DIAGNOSIS — F1124 Opioid dependence with opioid-induced mood disorder: Secondary | ICD-10-CM | POA: Diagnosis not present

## 2020-02-05 DIAGNOSIS — E669 Obesity, unspecified: Secondary | ICD-10-CM | POA: Diagnosis not present

## 2020-02-05 DIAGNOSIS — F1220 Cannabis dependence, uncomplicated: Secondary | ICD-10-CM | POA: Diagnosis not present

## 2020-02-06 DIAGNOSIS — F1220 Cannabis dependence, uncomplicated: Secondary | ICD-10-CM | POA: Diagnosis not present

## 2020-02-06 DIAGNOSIS — F1124 Opioid dependence with opioid-induced mood disorder: Secondary | ICD-10-CM | POA: Diagnosis not present

## 2020-02-06 DIAGNOSIS — E669 Obesity, unspecified: Secondary | ICD-10-CM | POA: Diagnosis not present

## 2020-02-06 DIAGNOSIS — R69 Illness, unspecified: Secondary | ICD-10-CM | POA: Diagnosis not present

## 2020-02-26 DIAGNOSIS — R69 Illness, unspecified: Secondary | ICD-10-CM | POA: Diagnosis not present

## 2020-02-26 DIAGNOSIS — F1124 Opioid dependence with opioid-induced mood disorder: Secondary | ICD-10-CM | POA: Diagnosis not present

## 2020-02-27 DIAGNOSIS — R69 Illness, unspecified: Secondary | ICD-10-CM | POA: Diagnosis not present

## 2020-03-05 DIAGNOSIS — R69 Illness, unspecified: Secondary | ICD-10-CM | POA: Diagnosis not present

## 2020-03-18 DIAGNOSIS — F1124 Opioid dependence with opioid-induced mood disorder: Secondary | ICD-10-CM | POA: Diagnosis not present

## 2020-03-18 DIAGNOSIS — R69 Illness, unspecified: Secondary | ICD-10-CM | POA: Diagnosis not present

## 2020-05-06 DIAGNOSIS — R69 Illness, unspecified: Secondary | ICD-10-CM | POA: Diagnosis not present

## 2020-05-06 DIAGNOSIS — F1124 Opioid dependence with opioid-induced mood disorder: Secondary | ICD-10-CM | POA: Diagnosis not present

## 2020-05-11 DIAGNOSIS — R69 Illness, unspecified: Secondary | ICD-10-CM | POA: Diagnosis not present

## 2020-05-20 DIAGNOSIS — F1124 Opioid dependence with opioid-induced mood disorder: Secondary | ICD-10-CM | POA: Diagnosis not present

## 2020-05-20 DIAGNOSIS — R69 Illness, unspecified: Secondary | ICD-10-CM | POA: Diagnosis not present

## 2020-05-29 DIAGNOSIS — R69 Illness, unspecified: Secondary | ICD-10-CM | POA: Diagnosis not present

## 2020-06-03 DIAGNOSIS — R69 Illness, unspecified: Secondary | ICD-10-CM | POA: Diagnosis not present

## 2020-06-03 DIAGNOSIS — F1124 Opioid dependence with opioid-induced mood disorder: Secondary | ICD-10-CM | POA: Diagnosis not present

## 2020-06-17 DIAGNOSIS — F1124 Opioid dependence with opioid-induced mood disorder: Secondary | ICD-10-CM | POA: Diagnosis not present

## 2020-06-17 DIAGNOSIS — R69 Illness, unspecified: Secondary | ICD-10-CM | POA: Diagnosis not present

## 2020-06-22 DIAGNOSIS — R69 Illness, unspecified: Secondary | ICD-10-CM | POA: Diagnosis not present

## 2020-07-08 DIAGNOSIS — F1124 Opioid dependence with opioid-induced mood disorder: Secondary | ICD-10-CM | POA: Diagnosis not present

## 2020-07-08 DIAGNOSIS — R69 Illness, unspecified: Secondary | ICD-10-CM | POA: Diagnosis not present

## 2020-07-13 IMAGING — NM NM HEPATO W/GB/PHARM/[PERSON_NAME]
3 series · 13 of 13 positions shown · non-contrast
Comparison: None

CLINICAL DATA: Nausea and vomiting for 2 months

EXAM:
NUCLEAR MEDICINE HEPATOBILIARY IMAGING WITH GALLBLADDER EF
TECHNIQUE: Sequential images of the abdomen were obtained [DATE] minutes
following intravenous administration of radiopharmaceutical. After
oral ingestion of Ensure, gallbladder ejection fraction was
determined. At 60 min, normal ejection fraction is greater than 33%.
RADIOPHARMACEUTICALS:  5 mCi 8c-VVm  Choletec IV

[Series 1: biliary · 3.25mm/px · 6 of 45 frames shown]
[frame 4/45]
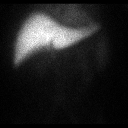
[frame 12/45]
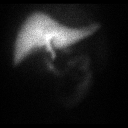
[frame 19/45]
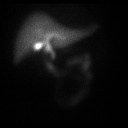
[frame 27/45]
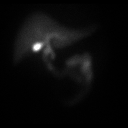
[frame 34/45]
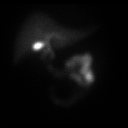
[frame 42/45]
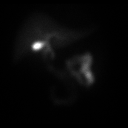

[Series 2: rt lat · 3.25mm/px · 1 of 1 slices shown]
[im 1/1]
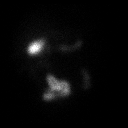

[Series 3: gbef · 3.25mm/px · 6 of 60 frames shown]
[frame 6/60]
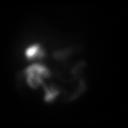
[frame 16/60]
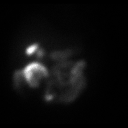
[frame 26/60]
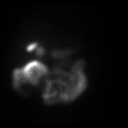
[frame 36/60]
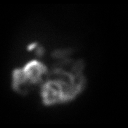
[frame 46/60]
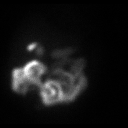
[frame 56/60]
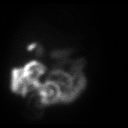

[13 of 13 positions shown; findings below may reference images not displayed]

FINDINGS: Normal tracer extraction from bloodstream indicating normal
hepatocellular function.

Normal excretion of tracer into biliary tree.

Gallbladder visualized at 16 min.

Small bowel visualized at 10 min.

No hepatic retention of tracer.

Subjectively normal emptying of tracer from gallbladder following
fatty meal stimulation.

Calculated gallbladder ejection fraction is 85%, normal.

Patient reported nausea following Ensure ingestion.

Normal gallbladder ejection fraction following Ensure ingestion is
greater than 33% at 1 hour.
IMPRESSION: Patent biliary tree.

Normal gallbladder ejection fraction of 85% following fatty meal
stimulation.

Patient reported nausea following Ensure ingestion.

## 2020-07-20 DIAGNOSIS — R69 Illness, unspecified: Secondary | ICD-10-CM | POA: Diagnosis not present

## 2020-07-27 DIAGNOSIS — R69 Illness, unspecified: Secondary | ICD-10-CM | POA: Diagnosis not present

## 2020-08-19 DIAGNOSIS — E669 Obesity, unspecified: Secondary | ICD-10-CM | POA: Diagnosis not present

## 2020-08-19 DIAGNOSIS — F39 Unspecified mood [affective] disorder: Secondary | ICD-10-CM | POA: Diagnosis not present

## 2020-08-19 DIAGNOSIS — R69 Illness, unspecified: Secondary | ICD-10-CM | POA: Diagnosis not present

## 2020-08-26 DIAGNOSIS — R69 Illness, unspecified: Secondary | ICD-10-CM | POA: Diagnosis not present

## 2020-09-02 DIAGNOSIS — F1124 Opioid dependence with opioid-induced mood disorder: Secondary | ICD-10-CM | POA: Diagnosis not present

## 2020-09-02 DIAGNOSIS — K5903 Drug induced constipation: Secondary | ICD-10-CM | POA: Diagnosis not present

## 2020-09-02 DIAGNOSIS — R69 Illness, unspecified: Secondary | ICD-10-CM | POA: Diagnosis not present

## 2020-09-06 ENCOUNTER — Other Ambulatory Visit: Payer: Self-pay

## 2020-09-06 ENCOUNTER — Emergency Department (HOSPITAL_COMMUNITY): Payer: 59

## 2020-09-06 ENCOUNTER — Encounter (HOSPITAL_COMMUNITY): Payer: Self-pay | Admitting: Emergency Medicine

## 2020-09-06 ENCOUNTER — Emergency Department (HOSPITAL_COMMUNITY)
Admission: EM | Admit: 2020-09-06 | Discharge: 2020-09-06 | Disposition: A | Discharge status: Home / Self Care | Payer: 59 | Attending: Emergency Medicine | Admitting: Emergency Medicine

## 2020-09-06 DIAGNOSIS — F17210 Nicotine dependence, cigarettes, uncomplicated: Secondary | ICD-10-CM | POA: Diagnosis not present

## 2020-09-06 DIAGNOSIS — M549 Dorsalgia, unspecified: Secondary | ICD-10-CM | POA: Insufficient documentation

## 2020-09-06 DIAGNOSIS — G8929 Other chronic pain: Secondary | ICD-10-CM | POA: Insufficient documentation

## 2020-09-06 DIAGNOSIS — K5903 Drug induced constipation: Secondary | ICD-10-CM | POA: Insufficient documentation

## 2020-09-06 DIAGNOSIS — J45909 Unspecified asthma, uncomplicated: Secondary | ICD-10-CM | POA: Insufficient documentation

## 2020-09-06 DIAGNOSIS — R109 Unspecified abdominal pain: Secondary | ICD-10-CM | POA: Insufficient documentation

## 2020-09-06 DIAGNOSIS — Z950 Presence of cardiac pacemaker: Secondary | ICD-10-CM | POA: Insufficient documentation

## 2020-09-06 DIAGNOSIS — K5900 Constipation, unspecified: Secondary | ICD-10-CM | POA: Diagnosis not present

## 2020-09-06 DIAGNOSIS — R69 Illness, unspecified: Secondary | ICD-10-CM | POA: Diagnosis not present

## 2020-09-06 NOTE — Discharge Instructions (Addendum)
You may want to try using a mineral oil enema prior to starting your constipation medication.  You can also take magnesium citrate which you may buy over-the-counter.  Return to emergency department for any worsening symptoms.

## 2020-09-06 NOTE — ED Triage Notes (Signed)
Pt states he has not had a BM x3weeks. Pt has tried laxatives, Miramax and 1 enema w/ no success.

## 2020-09-06 NOTE — ED Notes (Addendum)
Pt with constipation for past 3 weeks, hx of constipation but not like this per pt.  Pt states he has tried stool softeners and miralax

## 2020-09-06 NOTE — ED Provider Notes (Signed)
[facility] Provider Note   CSN: [id number] Arrival date & time: 09/06/20  1750     History Chief Complaint  Patient presents with   Constipation    Neil Sanders is a 30 y.o. male.   Constipation Associated symptoms: no abdominal pain, no dysuria, no fever, no nausea and no vomiting       Neil Sanders is a 30 y.o. male who presents to the Emergency Department for evaluation of possible constipation.  States he has not had a significant bowel movement in 3 weeks.  He initially tried a fleets enema at onset but none since.  He is intermittently taken MiraLAX without improvement.  He does take Suboxone daily for chronic back pain and prior opiate abuse.  He states that his pain management provider has prescribed a medication for his constipation, but he wanted to come to the emergency room for evaluation prior to starting the medication to ensure that he did not have an intestinal blockage.  He complains of bloating and fullness of his abdomen, but denies significant pain, vomiting, back pain.  Continues to have flatus.  Past Medical History:  Diagnosis Date   Anxiety    Asthma    Asthma    Back pain    Complete heart block (HCC)    s/p epicardial PPM implant at [facility] 03/01/11   GERD (gastroesophageal reflux disease)    Knee pain    Myocardial infarction [facility]) 2013   Perimembranous ventricular septal defect    repaired at [facility] with patch 03/01/11   Presence of permanent cardiac pacemaker    PUD (peptic ulcer disease)    Pulmonary hypertension (HCC)     Patient Active Problem List   Diagnosis Date Noted   External hemorrhoids 09/19/2019   Rectal bleeding 09/19/2019   Abdominal pain, chronic, epigastric 09/18/2017   GERD (gastroesophageal reflux disease) 02/11/2011   Ventricular septal defect 02/09/2011   Pulmonary hypertension (HCC) 02/09/2011   Tobacco abuse 02/08/2011   Marijuana abuse 02/08/2011   Alcohol abuse 02/08/2011   Complete  heart block (HCC) 02/08/2011   Acute blood loss anemia 02/08/2011   Closed cervical spine fracture (HCC) 02/07/2011   Traumatic brain injury, closed (HCC) 02/07/2011   Pulmonary contusion 02/07/2011   Sacral fracture, closed (HCC) 02/07/2011   Pubic ramus fracture (HCC) 02/07/2011   Facial contusion 02/07/2011   Aspiration into respiratory tract 02/07/2011   AV block, complete (HCC) 02/07/2011    Past Surgical History:  Procedure Laterality Date   ESOPHAGOGASTRODUODENOSCOPY (EGD) WITH PROPOFOL N/A 10/06/2017   Procedure: ESOPHAGOGASTRODUODENOSCOPY (EGD) WITH PROPOFOL;  Surgeon: Malissa Hippo, MD;  Location: [facility];  Service: Endoscopy;  Laterality: N/A;  12:30   EYE SURGERY Right    cyst removed from eyebrow   PACEMAKER INSERTION  03/01/2011   epicardial MDT pacing system implanted at [facility] x2   TONSILLECTOMY     VSD REPAIR  03/01/11   patch repair at [facility]       Family History  Problem Relation Age of Onset   Sudden death Other        paternal great uncle and paternal great grandfather both died suddenly at home    Social History   Tobacco Use   Smoking status: Every Day    Packs/day: 0.25    Years: 2.00    Pack years: 0.50    Types: Cigarettes   Smokeless tobacco: Never   Tobacco comments:    1/4 pack a day.  Vaping Use   Vaping Use: Never used  Substance Use Topics   Alcohol use: No   Drug use: Yes    Types: Marijuana    Comment: family suspicious that he may be using some pain medications. opiates. last use of marijuana 01/05/14    Home Medications Prior to Admission medications   Medication Sig Start Date End Date Taking? Authorizing Provider  buprenorphine-naloxone (SUBOXONE) 8-2 mg SUBL SL tablet Place 1 tablet under the tongue 2 (two) times daily.    [provider]  ibuprofen (ADVIL,MOTRIN) 200 MG tablet Take 800 mg by mouth every 6 (six) hours as needed for headache or moderate pain.    [provider]  mirtazapine (REMERON  SOL-TAB) 15 MG disintegrating tablet Take 15 mg by mouth at bedtime. Patient not taking: Reported on 09/19/2019    [provider]  ondansetron (ZOFRAN) 8 MG tablet Take 1 tablet (8 mg total) by mouth 2 (two) times daily as needed for nausea or vomiting. 09/19/19   Neil Sanders, Neil Boom, MD  pantoprazole (PROTONIX) 40 MG tablet Take 1 tablet (40 mg total) by mouth daily. 09/19/19   Neil Frame, MD  promethazine (PHENERGAN) 25 MG tablet Take 1 tablet (25 mg total) by mouth every 6 (six) hours as needed. Patient not taking: Reported on 09/19/2019 06/26/14   Neil Hutching, MD  QUEtiapine (SEROQUEL) 100 MG tablet Take 100 mg by mouth at bedtime.    [provider]    Allergies    Bee venom and Sulfa antibiotics  Review of Systems   Review of Systems  Constitutional:  Negative for chills, fatigue and fever.  Respiratory:  Negative for cough and shortness of breath.   Cardiovascular:  Negative for chest pain.  Gastrointestinal:  Positive for constipation. Negative for abdominal pain, blood in stool, nausea and vomiting.  Genitourinary:  Negative for dysuria.  Musculoskeletal:  Negative for arthralgias and myalgias.  Skin:  Negative for rash.  Neurological:  Negative for dizziness, weakness and numbness.  Hematological:  Does not bruise/bleed easily.   Physical Exam Updated Vital Signs BP (!) 131/98 (BP Location: Right Arm)   Pulse 63   Temp (!) 97.4 F (36.3 C) (Oral)   Resp 16   Ht 6' (1.829 m)   Wt 136.1 kg   SpO2 97%   BMI 40.69 kg/m   Physical Exam Vitals and nursing note reviewed.  Constitutional:      General: He is not in acute distress.    Appearance: Normal appearance.  Cardiovascular:     Rate and Rhythm: Normal rate and regular rhythm.     Pulses: Normal pulses.  Pulmonary:     Effort: Pulmonary effort is normal.     Breath sounds: Normal breath sounds.  Abdominal:     Palpations: Abdomen is soft.     Tenderness: There is no  abdominal tenderness. There is no guarding.  Musculoskeletal:        General: Normal range of motion.  Skin:    General: Skin is warm.     Capillary Refill: Capillary refill takes less than 2 seconds.  Neurological:     General: No focal deficit present.     Mental Status: He is alert.     Sensory: No sensory deficit.     Motor: No weakness.    ED Results / Procedures / Treatments   Labs (all labs ordered are listed, but only abnormal results are displayed) Labs Reviewed - No data to display  EKG  None  Radiology DG Abdomen 1 View  Result Date: 09/06/2020 CLINICAL DATA:  Constipation EXAM: ABDOMEN - 1 VIEW COMPARISON:  None. FINDINGS: Large stool burden throughout the colon. There is a non obstructive bowel gas pattern. No supine evidence of free air. No organomegaly or suspicious calcification. No acute bony abnormality. Pacer battery pack noted in the left abdomen. IMPRESSION: Large stool burden.  No acute findings. Electronically Signed   By: Charlett Nose M.D.   On: 09/06/2020 21:41    Procedures Procedures   Medications Ordered in ED Medications - No data to display  ED Course  I have reviewed the triage vital signs and the nursing notes.  Pertinent labs & imaging results that were available during my care of the patient were reviewed by me and considered in my medical decision making (see chart for details).    MDM Rules/Calculators/A&P                           Patient here requesting evaluation for constipation.  Denies significant bowel movement x3 weeks.  Takes Suboxone daily.  States his pain management provider has prescribed medication for his opioid-induced constipation, but he wanted to be evaluated for possible obstruction prior to taking the medication.  He denies any fever, chills, or vomiting.  No significant abdominal pain on exam.  1 view x-ray of the abdomen shows a large stool burden without evidence of obstruction.  Treatment options discussed  including daily MiraLAX and use of mag citrate.  Patient states he prefers to use medication prescribed by his provider.  He is otherwise well-appearing nontoxic.  Tolerating oral fluids and eating crackers here.  Appears appropriate for discharge home.  Return precautions were discussed.  Final Clinical Impression(s) / ED Diagnoses Final diagnoses:  Drug-induced constipation    Rx / DC Orders ED Discharge Orders     None        Rosey Bath 09/06/20 2255    Pollyann Savoy, MD 09/06/20 2324

## 2020-09-16 DIAGNOSIS — F1124 Opioid dependence with opioid-induced mood disorder: Secondary | ICD-10-CM | POA: Diagnosis not present

## 2020-09-16 DIAGNOSIS — R69 Illness, unspecified: Secondary | ICD-10-CM | POA: Diagnosis not present

## 2020-09-23 DIAGNOSIS — R69 Illness, unspecified: Secondary | ICD-10-CM | POA: Diagnosis not present

## 2020-10-01 DIAGNOSIS — R69 Illness, unspecified: Secondary | ICD-10-CM | POA: Diagnosis not present

## 2020-10-15 DIAGNOSIS — R69 Illness, unspecified: Secondary | ICD-10-CM | POA: Diagnosis not present

## 2020-10-15 DIAGNOSIS — F411 Generalized anxiety disorder: Secondary | ICD-10-CM | POA: Diagnosis not present

## 2020-10-15 DIAGNOSIS — F321 Major depressive disorder, single episode, moderate: Secondary | ICD-10-CM | POA: Diagnosis not present

## 2020-10-22 DIAGNOSIS — R69 Illness, unspecified: Secondary | ICD-10-CM | POA: Diagnosis not present

## 2020-10-29 DIAGNOSIS — R69 Illness, unspecified: Secondary | ICD-10-CM | POA: Diagnosis not present

## 2020-11-05 DIAGNOSIS — R69 Illness, unspecified: Secondary | ICD-10-CM | POA: Diagnosis not present

## 2020-11-05 DIAGNOSIS — F411 Generalized anxiety disorder: Secondary | ICD-10-CM | POA: Diagnosis not present

## 2020-11-05 DIAGNOSIS — F321 Major depressive disorder, single episode, moderate: Secondary | ICD-10-CM | POA: Diagnosis not present

## 2020-11-12 ENCOUNTER — Ambulatory Visit (INDEPENDENT_AMBULATORY_CARE_PROVIDER_SITE_OTHER): Payer: No Typology Code available for payment source | Admitting: Gastroenterology

## 2020-12-03 DIAGNOSIS — R69 Illness, unspecified: Secondary | ICD-10-CM | POA: Diagnosis not present

## 2021-01-19 DIAGNOSIS — R69 Illness, unspecified: Secondary | ICD-10-CM | POA: Diagnosis not present

## 2021-01-19 DIAGNOSIS — E559 Vitamin D deficiency, unspecified: Secondary | ICD-10-CM | POA: Diagnosis not present

## 2021-01-19 DIAGNOSIS — E119 Type 2 diabetes mellitus without complications: Secondary | ICD-10-CM | POA: Diagnosis not present

## 2021-01-19 DIAGNOSIS — Z79891 Long term (current) use of opiate analgesic: Secondary | ICD-10-CM | POA: Diagnosis not present

## 2021-01-19 DIAGNOSIS — E039 Hypothyroidism, unspecified: Secondary | ICD-10-CM | POA: Diagnosis not present

## 2021-01-19 DIAGNOSIS — Z131 Encounter for screening for diabetes mellitus: Secondary | ICD-10-CM | POA: Diagnosis not present

## 2021-01-19 DIAGNOSIS — E785 Hyperlipidemia, unspecified: Secondary | ICD-10-CM | POA: Diagnosis not present

## 2021-01-19 DIAGNOSIS — F1124 Opioid dependence with opioid-induced mood disorder: Secondary | ICD-10-CM | POA: Diagnosis not present

## 2021-01-22 DIAGNOSIS — Z79899 Other long term (current) drug therapy: Secondary | ICD-10-CM | POA: Diagnosis not present

## 2021-01-22 DIAGNOSIS — Z79891 Long term (current) use of opiate analgesic: Secondary | ICD-10-CM | POA: Diagnosis not present

## 2021-01-22 DIAGNOSIS — R69 Illness, unspecified: Secondary | ICD-10-CM | POA: Diagnosis not present

## 2021-01-22 DIAGNOSIS — F1124 Opioid dependence with opioid-induced mood disorder: Secondary | ICD-10-CM | POA: Diagnosis not present

## 2021-01-29 DIAGNOSIS — Z79899 Other long term (current) drug therapy: Secondary | ICD-10-CM | POA: Diagnosis not present

## 2021-01-29 DIAGNOSIS — F1124 Opioid dependence with opioid-induced mood disorder: Secondary | ICD-10-CM | POA: Diagnosis not present

## 2021-01-29 DIAGNOSIS — R69 Illness, unspecified: Secondary | ICD-10-CM | POA: Diagnosis not present

## 2021-01-29 DIAGNOSIS — F12920 Cannabis use, unspecified with intoxication, uncomplicated: Secondary | ICD-10-CM | POA: Diagnosis not present

## 2021-01-29 DIAGNOSIS — Z79891 Long term (current) use of opiate analgesic: Secondary | ICD-10-CM | POA: Diagnosis not present

## 2021-02-05 DIAGNOSIS — F12920 Cannabis use, unspecified with intoxication, uncomplicated: Secondary | ICD-10-CM | POA: Diagnosis not present

## 2021-02-05 DIAGNOSIS — F1124 Opioid dependence with opioid-induced mood disorder: Secondary | ICD-10-CM | POA: Diagnosis not present

## 2021-02-05 DIAGNOSIS — Z79891 Long term (current) use of opiate analgesic: Secondary | ICD-10-CM | POA: Diagnosis not present

## 2021-02-05 DIAGNOSIS — R69 Illness, unspecified: Secondary | ICD-10-CM | POA: Diagnosis not present

## 2021-02-05 DIAGNOSIS — Z79899 Other long term (current) drug therapy: Secondary | ICD-10-CM | POA: Diagnosis not present

## 2021-02-12 DIAGNOSIS — F12920 Cannabis use, unspecified with intoxication, uncomplicated: Secondary | ICD-10-CM | POA: Diagnosis not present

## 2021-02-12 DIAGNOSIS — Z79891 Long term (current) use of opiate analgesic: Secondary | ICD-10-CM | POA: Diagnosis not present

## 2021-02-12 DIAGNOSIS — R69 Illness, unspecified: Secondary | ICD-10-CM | POA: Diagnosis not present

## 2021-02-12 DIAGNOSIS — F1124 Opioid dependence with opioid-induced mood disorder: Secondary | ICD-10-CM | POA: Diagnosis not present

## 2021-02-19 DIAGNOSIS — Z79891 Long term (current) use of opiate analgesic: Secondary | ICD-10-CM | POA: Diagnosis not present

## 2021-02-19 DIAGNOSIS — R69 Illness, unspecified: Secondary | ICD-10-CM | POA: Diagnosis not present

## 2021-02-19 DIAGNOSIS — F1124 Opioid dependence with opioid-induced mood disorder: Secondary | ICD-10-CM | POA: Diagnosis not present

## 2021-02-19 DIAGNOSIS — F12920 Cannabis use, unspecified with intoxication, uncomplicated: Secondary | ICD-10-CM | POA: Diagnosis not present

## 2021-02-19 DIAGNOSIS — Z79899 Other long term (current) drug therapy: Secondary | ICD-10-CM | POA: Diagnosis not present

## 2021-03-01 DIAGNOSIS — R69 Illness, unspecified: Secondary | ICD-10-CM | POA: Diagnosis not present

## 2021-03-01 DIAGNOSIS — F12920 Cannabis use, unspecified with intoxication, uncomplicated: Secondary | ICD-10-CM | POA: Diagnosis not present

## 2021-03-01 DIAGNOSIS — Z79891 Long term (current) use of opiate analgesic: Secondary | ICD-10-CM | POA: Diagnosis not present

## 2021-03-01 DIAGNOSIS — F1124 Opioid dependence with opioid-induced mood disorder: Secondary | ICD-10-CM | POA: Diagnosis not present

## 2021-03-01 DIAGNOSIS — Z79899 Other long term (current) drug therapy: Secondary | ICD-10-CM | POA: Diagnosis not present

## 2021-03-08 DIAGNOSIS — R69 Illness, unspecified: Secondary | ICD-10-CM | POA: Diagnosis not present

## 2021-03-08 DIAGNOSIS — F1124 Opioid dependence with opioid-induced mood disorder: Secondary | ICD-10-CM | POA: Diagnosis not present

## 2021-03-08 DIAGNOSIS — Z5181 Encounter for therapeutic drug level monitoring: Secondary | ICD-10-CM | POA: Diagnosis not present

## 2021-03-08 DIAGNOSIS — Z79899 Other long term (current) drug therapy: Secondary | ICD-10-CM | POA: Diagnosis not present

## 2021-03-08 DIAGNOSIS — F12920 Cannabis use, unspecified with intoxication, uncomplicated: Secondary | ICD-10-CM | POA: Diagnosis not present

## 2021-03-22 DIAGNOSIS — F1124 Opioid dependence with opioid-induced mood disorder: Secondary | ICD-10-CM | POA: Diagnosis not present

## 2021-03-22 DIAGNOSIS — Z5181 Encounter for therapeutic drug level monitoring: Secondary | ICD-10-CM | POA: Diagnosis not present

## 2021-03-22 DIAGNOSIS — F12920 Cannabis use, unspecified with intoxication, uncomplicated: Secondary | ICD-10-CM | POA: Diagnosis not present

## 2021-03-22 DIAGNOSIS — R69 Illness, unspecified: Secondary | ICD-10-CM | POA: Diagnosis not present

## 2021-03-22 DIAGNOSIS — Z79899 Other long term (current) drug therapy: Secondary | ICD-10-CM | POA: Diagnosis not present

## 2021-04-05 DIAGNOSIS — R69 Illness, unspecified: Secondary | ICD-10-CM | POA: Diagnosis not present

## 2021-04-05 DIAGNOSIS — F12920 Cannabis use, unspecified with intoxication, uncomplicated: Secondary | ICD-10-CM | POA: Diagnosis not present

## 2021-04-05 DIAGNOSIS — Z79899 Other long term (current) drug therapy: Secondary | ICD-10-CM | POA: Diagnosis not present

## 2021-04-05 DIAGNOSIS — Z5181 Encounter for therapeutic drug level monitoring: Secondary | ICD-10-CM | POA: Diagnosis not present

## 2021-04-05 DIAGNOSIS — F1124 Opioid dependence with opioid-induced mood disorder: Secondary | ICD-10-CM | POA: Diagnosis not present

## 2021-04-23 DIAGNOSIS — F1124 Opioid dependence with opioid-induced mood disorder: Secondary | ICD-10-CM | POA: Diagnosis not present

## 2021-04-23 DIAGNOSIS — F12920 Cannabis use, unspecified with intoxication, uncomplicated: Secondary | ICD-10-CM | POA: Diagnosis not present

## 2021-04-23 DIAGNOSIS — Z5181 Encounter for therapeutic drug level monitoring: Secondary | ICD-10-CM | POA: Diagnosis not present

## 2021-04-23 DIAGNOSIS — Z79899 Other long term (current) drug therapy: Secondary | ICD-10-CM | POA: Diagnosis not present

## 2021-04-23 DIAGNOSIS — R69 Illness, unspecified: Secondary | ICD-10-CM | POA: Diagnosis not present

## 2021-05-14 DIAGNOSIS — F1124 Opioid dependence with opioid-induced mood disorder: Secondary | ICD-10-CM | POA: Diagnosis not present

## 2021-05-14 DIAGNOSIS — F12920 Cannabis use, unspecified with intoxication, uncomplicated: Secondary | ICD-10-CM | POA: Diagnosis not present

## 2021-05-14 DIAGNOSIS — R69 Illness, unspecified: Secondary | ICD-10-CM | POA: Diagnosis not present

## 2021-05-14 DIAGNOSIS — Z5181 Encounter for therapeutic drug level monitoring: Secondary | ICD-10-CM | POA: Diagnosis not present

## 2021-05-14 DIAGNOSIS — Z79899 Other long term (current) drug therapy: Secondary | ICD-10-CM | POA: Diagnosis not present

## 2021-05-24 DIAGNOSIS — F1124 Opioid dependence with opioid-induced mood disorder: Secondary | ICD-10-CM | POA: Diagnosis not present

## 2021-05-24 DIAGNOSIS — F12920 Cannabis use, unspecified with intoxication, uncomplicated: Secondary | ICD-10-CM | POA: Diagnosis not present

## 2021-05-24 DIAGNOSIS — R69 Illness, unspecified: Secondary | ICD-10-CM | POA: Diagnosis not present

## 2021-05-24 DIAGNOSIS — Z5181 Encounter for therapeutic drug level monitoring: Secondary | ICD-10-CM | POA: Diagnosis not present

## 2021-05-28 DIAGNOSIS — D519 Vitamin B12 deficiency anemia, unspecified: Secondary | ICD-10-CM | POA: Diagnosis not present

## 2021-05-28 DIAGNOSIS — Z5181 Encounter for therapeutic drug level monitoring: Secondary | ICD-10-CM | POA: Diagnosis not present

## 2021-05-28 DIAGNOSIS — R69 Illness, unspecified: Secondary | ICD-10-CM | POA: Diagnosis not present

## 2021-05-28 DIAGNOSIS — F12920 Cannabis use, unspecified with intoxication, uncomplicated: Secondary | ICD-10-CM | POA: Diagnosis not present

## 2021-05-28 DIAGNOSIS — Z139 Encounter for screening, unspecified: Secondary | ICD-10-CM | POA: Diagnosis not present

## 2021-05-28 DIAGNOSIS — F1124 Opioid dependence with opioid-induced mood disorder: Secondary | ICD-10-CM | POA: Diagnosis not present

## 2021-06-04 DIAGNOSIS — R69 Illness, unspecified: Secondary | ICD-10-CM | POA: Diagnosis not present

## 2021-06-04 DIAGNOSIS — F1124 Opioid dependence with opioid-induced mood disorder: Secondary | ICD-10-CM | POA: Diagnosis not present

## 2021-06-04 DIAGNOSIS — Z5181 Encounter for therapeutic drug level monitoring: Secondary | ICD-10-CM | POA: Diagnosis not present

## 2021-06-04 DIAGNOSIS — F12920 Cannabis use, unspecified with intoxication, uncomplicated: Secondary | ICD-10-CM | POA: Diagnosis not present

## 2021-06-21 DIAGNOSIS — F1124 Opioid dependence with opioid-induced mood disorder: Secondary | ICD-10-CM | POA: Diagnosis not present

## 2021-06-21 DIAGNOSIS — F12920 Cannabis use, unspecified with intoxication, uncomplicated: Secondary | ICD-10-CM | POA: Diagnosis not present

## 2021-06-21 DIAGNOSIS — Z5181 Encounter for therapeutic drug level monitoring: Secondary | ICD-10-CM | POA: Diagnosis not present

## 2021-06-21 DIAGNOSIS — R69 Illness, unspecified: Secondary | ICD-10-CM | POA: Diagnosis not present

## 2021-07-05 DIAGNOSIS — F1124 Opioid dependence with opioid-induced mood disorder: Secondary | ICD-10-CM | POA: Diagnosis not present

## 2021-07-05 DIAGNOSIS — R69 Illness, unspecified: Secondary | ICD-10-CM | POA: Diagnosis not present

## 2021-07-05 DIAGNOSIS — Z79899 Other long term (current) drug therapy: Secondary | ICD-10-CM | POA: Diagnosis not present

## 2021-08-11 DIAGNOSIS — R69 Illness, unspecified: Secondary | ICD-10-CM | POA: Diagnosis not present

## 2021-08-11 DIAGNOSIS — G4700 Insomnia, unspecified: Secondary | ICD-10-CM | POA: Diagnosis not present

## 2021-08-11 DIAGNOSIS — F1290 Cannabis use, unspecified, uncomplicated: Secondary | ICD-10-CM | POA: Diagnosis not present

## 2021-08-18 DIAGNOSIS — F1290 Cannabis use, unspecified, uncomplicated: Secondary | ICD-10-CM | POA: Diagnosis not present

## 2021-08-18 DIAGNOSIS — R69 Illness, unspecified: Secondary | ICD-10-CM | POA: Diagnosis not present

## 2021-08-19 DIAGNOSIS — G4700 Insomnia, unspecified: Secondary | ICD-10-CM | POA: Diagnosis not present

## 2021-08-19 DIAGNOSIS — F1290 Cannabis use, unspecified, uncomplicated: Secondary | ICD-10-CM | POA: Diagnosis not present

## 2021-08-19 DIAGNOSIS — R69 Illness, unspecified: Secondary | ICD-10-CM | POA: Diagnosis not present

## 2021-08-25 DIAGNOSIS — R69 Illness, unspecified: Secondary | ICD-10-CM | POA: Diagnosis not present

## 2021-08-25 DIAGNOSIS — F1290 Cannabis use, unspecified, uncomplicated: Secondary | ICD-10-CM | POA: Diagnosis not present

## 2021-08-26 DIAGNOSIS — R69 Illness, unspecified: Secondary | ICD-10-CM | POA: Diagnosis not present

## 2021-08-26 DIAGNOSIS — G4700 Insomnia, unspecified: Secondary | ICD-10-CM | POA: Diagnosis not present

## 2021-08-26 DIAGNOSIS — F1290 Cannabis use, unspecified, uncomplicated: Secondary | ICD-10-CM | POA: Diagnosis not present

## 2021-09-01 DIAGNOSIS — F1290 Cannabis use, unspecified, uncomplicated: Secondary | ICD-10-CM | POA: Diagnosis not present

## 2021-09-01 DIAGNOSIS — R69 Illness, unspecified: Secondary | ICD-10-CM | POA: Diagnosis not present

## 2021-09-02 DIAGNOSIS — R69 Illness, unspecified: Secondary | ICD-10-CM | POA: Diagnosis not present

## 2021-09-02 DIAGNOSIS — G4700 Insomnia, unspecified: Secondary | ICD-10-CM | POA: Diagnosis not present

## 2021-09-02 DIAGNOSIS — F1290 Cannabis use, unspecified, uncomplicated: Secondary | ICD-10-CM | POA: Diagnosis not present

## 2021-09-08 DIAGNOSIS — R69 Illness, unspecified: Secondary | ICD-10-CM | POA: Diagnosis not present

## 2021-09-08 DIAGNOSIS — F1290 Cannabis use, unspecified, uncomplicated: Secondary | ICD-10-CM | POA: Diagnosis not present

## 2021-09-09 DIAGNOSIS — F1290 Cannabis use, unspecified, uncomplicated: Secondary | ICD-10-CM | POA: Diagnosis not present

## 2021-09-09 DIAGNOSIS — R69 Illness, unspecified: Secondary | ICD-10-CM | POA: Diagnosis not present

## 2021-09-09 DIAGNOSIS — G4700 Insomnia, unspecified: Secondary | ICD-10-CM | POA: Diagnosis not present

## 2021-09-15 DIAGNOSIS — R69 Illness, unspecified: Secondary | ICD-10-CM | POA: Diagnosis not present

## 2021-09-15 DIAGNOSIS — F1290 Cannabis use, unspecified, uncomplicated: Secondary | ICD-10-CM | POA: Diagnosis not present

## 2021-09-16 DIAGNOSIS — F1290 Cannabis use, unspecified, uncomplicated: Secondary | ICD-10-CM | POA: Diagnosis not present

## 2021-09-16 DIAGNOSIS — G4700 Insomnia, unspecified: Secondary | ICD-10-CM | POA: Diagnosis not present

## 2021-09-16 DIAGNOSIS — R69 Illness, unspecified: Secondary | ICD-10-CM | POA: Diagnosis not present

## 2021-09-22 DIAGNOSIS — F1290 Cannabis use, unspecified, uncomplicated: Secondary | ICD-10-CM | POA: Diagnosis not present

## 2021-09-22 DIAGNOSIS — R69 Illness, unspecified: Secondary | ICD-10-CM | POA: Diagnosis not present

## 2021-09-22 DIAGNOSIS — F419 Anxiety disorder, unspecified: Secondary | ICD-10-CM | POA: Diagnosis not present

## 2021-09-23 DIAGNOSIS — F1290 Cannabis use, unspecified, uncomplicated: Secondary | ICD-10-CM | POA: Diagnosis not present

## 2021-09-23 DIAGNOSIS — R69 Illness, unspecified: Secondary | ICD-10-CM | POA: Diagnosis not present

## 2021-09-23 DIAGNOSIS — G4700 Insomnia, unspecified: Secondary | ICD-10-CM | POA: Diagnosis not present

## 2021-09-29 DIAGNOSIS — F419 Anxiety disorder, unspecified: Secondary | ICD-10-CM | POA: Diagnosis not present

## 2021-09-29 DIAGNOSIS — F1290 Cannabis use, unspecified, uncomplicated: Secondary | ICD-10-CM | POA: Diagnosis not present

## 2021-09-29 DIAGNOSIS — R69 Illness, unspecified: Secondary | ICD-10-CM | POA: Diagnosis not present

## 2021-09-30 DIAGNOSIS — F1290 Cannabis use, unspecified, uncomplicated: Secondary | ICD-10-CM | POA: Diagnosis not present

## 2021-09-30 DIAGNOSIS — G4700 Insomnia, unspecified: Secondary | ICD-10-CM | POA: Diagnosis not present

## 2021-09-30 DIAGNOSIS — R69 Illness, unspecified: Secondary | ICD-10-CM | POA: Diagnosis not present

## 2021-10-06 DIAGNOSIS — Z634 Disappearance and death of family member: Secondary | ICD-10-CM | POA: Diagnosis not present

## 2021-10-06 DIAGNOSIS — G4700 Insomnia, unspecified: Secondary | ICD-10-CM | POA: Diagnosis not present

## 2021-10-06 DIAGNOSIS — R69 Illness, unspecified: Secondary | ICD-10-CM | POA: Diagnosis not present

## 2021-10-06 DIAGNOSIS — F1290 Cannabis use, unspecified, uncomplicated: Secondary | ICD-10-CM | POA: Diagnosis not present

## 2021-10-07 DIAGNOSIS — R69 Illness, unspecified: Secondary | ICD-10-CM | POA: Diagnosis not present

## 2021-10-07 DIAGNOSIS — G4700 Insomnia, unspecified: Secondary | ICD-10-CM | POA: Diagnosis not present

## 2021-10-07 DIAGNOSIS — F1290 Cannabis use, unspecified, uncomplicated: Secondary | ICD-10-CM | POA: Diagnosis not present

## 2021-10-08 DIAGNOSIS — F1490 Cocaine use, unspecified, uncomplicated: Secondary | ICD-10-CM | POA: Diagnosis not present

## 2021-10-08 DIAGNOSIS — R69 Illness, unspecified: Secondary | ICD-10-CM | POA: Diagnosis not present

## 2021-10-08 DIAGNOSIS — F1290 Cannabis use, unspecified, uncomplicated: Secondary | ICD-10-CM | POA: Diagnosis not present

## 2021-10-08 DIAGNOSIS — F419 Anxiety disorder, unspecified: Secondary | ICD-10-CM | POA: Diagnosis not present

## 2021-10-13 DIAGNOSIS — F419 Anxiety disorder, unspecified: Secondary | ICD-10-CM | POA: Diagnosis not present

## 2021-10-13 DIAGNOSIS — F1490 Cocaine use, unspecified, uncomplicated: Secondary | ICD-10-CM | POA: Diagnosis not present

## 2021-10-13 DIAGNOSIS — F1290 Cannabis use, unspecified, uncomplicated: Secondary | ICD-10-CM | POA: Diagnosis not present

## 2021-10-13 DIAGNOSIS — R69 Illness, unspecified: Secondary | ICD-10-CM | POA: Diagnosis not present

## 2021-10-14 DIAGNOSIS — G4700 Insomnia, unspecified: Secondary | ICD-10-CM | POA: Diagnosis not present

## 2021-10-14 DIAGNOSIS — F1290 Cannabis use, unspecified, uncomplicated: Secondary | ICD-10-CM | POA: Diagnosis not present

## 2021-10-14 DIAGNOSIS — F1490 Cocaine use, unspecified, uncomplicated: Secondary | ICD-10-CM | POA: Diagnosis not present

## 2021-10-14 DIAGNOSIS — R69 Illness, unspecified: Secondary | ICD-10-CM | POA: Diagnosis not present

## 2021-10-14 DIAGNOSIS — F17200 Nicotine dependence, unspecified, uncomplicated: Secondary | ICD-10-CM | POA: Diagnosis not present

## 2021-10-21 DIAGNOSIS — R69 Illness, unspecified: Secondary | ICD-10-CM | POA: Diagnosis not present

## 2021-10-21 DIAGNOSIS — F1290 Cannabis use, unspecified, uncomplicated: Secondary | ICD-10-CM | POA: Diagnosis not present

## 2021-10-21 DIAGNOSIS — G4700 Insomnia, unspecified: Secondary | ICD-10-CM | POA: Diagnosis not present

## 2021-10-25 DIAGNOSIS — R69 Illness, unspecified: Secondary | ICD-10-CM | POA: Diagnosis not present

## 2021-10-25 DIAGNOSIS — F1290 Cannabis use, unspecified, uncomplicated: Secondary | ICD-10-CM | POA: Diagnosis not present

## 2021-10-25 DIAGNOSIS — F1490 Cocaine use, unspecified, uncomplicated: Secondary | ICD-10-CM | POA: Diagnosis not present

## 2021-11-01 DIAGNOSIS — R69 Illness, unspecified: Secondary | ICD-10-CM | POA: Diagnosis not present

## 2021-11-02 DIAGNOSIS — F17200 Nicotine dependence, unspecified, uncomplicated: Secondary | ICD-10-CM | POA: Diagnosis not present

## 2021-11-02 DIAGNOSIS — G4700 Insomnia, unspecified: Secondary | ICD-10-CM | POA: Diagnosis not present

## 2021-11-02 DIAGNOSIS — R69 Illness, unspecified: Secondary | ICD-10-CM | POA: Diagnosis not present

## 2021-11-02 DIAGNOSIS — J069 Acute upper respiratory infection, unspecified: Secondary | ICD-10-CM | POA: Diagnosis not present

## 2021-11-02 DIAGNOSIS — F1290 Cannabis use, unspecified, uncomplicated: Secondary | ICD-10-CM | POA: Diagnosis not present

## 2021-11-05 DIAGNOSIS — R69 Illness, unspecified: Secondary | ICD-10-CM | POA: Diagnosis not present

## 2021-11-05 DIAGNOSIS — F1490 Cocaine use, unspecified, uncomplicated: Secondary | ICD-10-CM | POA: Diagnosis not present

## 2021-11-05 DIAGNOSIS — F1290 Cannabis use, unspecified, uncomplicated: Secondary | ICD-10-CM | POA: Diagnosis not present

## 2021-11-05 DIAGNOSIS — F419 Anxiety disorder, unspecified: Secondary | ICD-10-CM | POA: Diagnosis not present

## 2021-11-11 DIAGNOSIS — F1490 Cocaine use, unspecified, uncomplicated: Secondary | ICD-10-CM | POA: Diagnosis not present

## 2021-11-11 DIAGNOSIS — G4700 Insomnia, unspecified: Secondary | ICD-10-CM | POA: Diagnosis not present

## 2021-11-11 DIAGNOSIS — F1290 Cannabis use, unspecified, uncomplicated: Secondary | ICD-10-CM | POA: Diagnosis not present

## 2021-11-11 DIAGNOSIS — F1120 Opioid dependence, uncomplicated: Secondary | ICD-10-CM | POA: Diagnosis not present

## 2021-11-11 DIAGNOSIS — F419 Anxiety disorder, unspecified: Secondary | ICD-10-CM | POA: Diagnosis not present

## 2021-11-11 DIAGNOSIS — F17200 Nicotine dependence, unspecified, uncomplicated: Secondary | ICD-10-CM | POA: Diagnosis not present

## 2021-11-12 DIAGNOSIS — F1290 Cannabis use, unspecified, uncomplicated: Secondary | ICD-10-CM | POA: Diagnosis not present

## 2021-11-12 DIAGNOSIS — F1490 Cocaine use, unspecified, uncomplicated: Secondary | ICD-10-CM | POA: Diagnosis not present

## 2021-11-12 DIAGNOSIS — R69 Illness, unspecified: Secondary | ICD-10-CM | POA: Diagnosis not present

## 2021-11-12 DIAGNOSIS — F419 Anxiety disorder, unspecified: Secondary | ICD-10-CM | POA: Diagnosis not present

## 2021-11-15 DIAGNOSIS — J40 Bronchitis, not specified as acute or chronic: Secondary | ICD-10-CM | POA: Diagnosis not present

## 2021-11-18 DIAGNOSIS — F1490 Cocaine use, unspecified, uncomplicated: Secondary | ICD-10-CM | POA: Diagnosis not present

## 2021-11-18 DIAGNOSIS — F1120 Opioid dependence, uncomplicated: Secondary | ICD-10-CM | POA: Diagnosis not present

## 2021-11-18 DIAGNOSIS — G4700 Insomnia, unspecified: Secondary | ICD-10-CM | POA: Diagnosis not present

## 2021-11-18 DIAGNOSIS — F419 Anxiety disorder, unspecified: Secondary | ICD-10-CM | POA: Diagnosis not present

## 2021-11-18 DIAGNOSIS — F17200 Nicotine dependence, unspecified, uncomplicated: Secondary | ICD-10-CM | POA: Diagnosis not present

## 2021-11-18 DIAGNOSIS — F1290 Cannabis use, unspecified, uncomplicated: Secondary | ICD-10-CM | POA: Diagnosis not present

## 2021-12-01 DIAGNOSIS — F1120 Opioid dependence, uncomplicated: Secondary | ICD-10-CM | POA: Diagnosis not present

## 2021-12-01 DIAGNOSIS — F419 Anxiety disorder, unspecified: Secondary | ICD-10-CM | POA: Diagnosis not present

## 2021-12-01 DIAGNOSIS — F1490 Cocaine use, unspecified, uncomplicated: Secondary | ICD-10-CM | POA: Diagnosis not present

## 2021-12-01 DIAGNOSIS — F1290 Cannabis use, unspecified, uncomplicated: Secondary | ICD-10-CM | POA: Diagnosis not present

## 2021-12-01 DIAGNOSIS — J069 Acute upper respiratory infection, unspecified: Secondary | ICD-10-CM | POA: Diagnosis not present

## 2021-12-01 DIAGNOSIS — G4700 Insomnia, unspecified: Secondary | ICD-10-CM | POA: Diagnosis not present

## 2021-12-09 DIAGNOSIS — F1120 Opioid dependence, uncomplicated: Secondary | ICD-10-CM | POA: Diagnosis not present

## 2021-12-09 DIAGNOSIS — G4700 Insomnia, unspecified: Secondary | ICD-10-CM | POA: Diagnosis not present

## 2021-12-09 DIAGNOSIS — F1290 Cannabis use, unspecified, uncomplicated: Secondary | ICD-10-CM | POA: Diagnosis not present

## 2021-12-09 DIAGNOSIS — F419 Anxiety disorder, unspecified: Secondary | ICD-10-CM | POA: Diagnosis not present

## 2021-12-09 DIAGNOSIS — F17200 Nicotine dependence, unspecified, uncomplicated: Secondary | ICD-10-CM | POA: Diagnosis not present

## 2021-12-09 DIAGNOSIS — F1490 Cocaine use, unspecified, uncomplicated: Secondary | ICD-10-CM | POA: Diagnosis not present

## 2021-12-17 DIAGNOSIS — F1290 Cannabis use, unspecified, uncomplicated: Secondary | ICD-10-CM | POA: Diagnosis not present

## 2021-12-17 DIAGNOSIS — F1490 Cocaine use, unspecified, uncomplicated: Secondary | ICD-10-CM | POA: Diagnosis not present

## 2021-12-17 DIAGNOSIS — F419 Anxiety disorder, unspecified: Secondary | ICD-10-CM | POA: Diagnosis not present

## 2021-12-17 DIAGNOSIS — G4700 Insomnia, unspecified: Secondary | ICD-10-CM | POA: Diagnosis not present

## 2021-12-17 DIAGNOSIS — F1120 Opioid dependence, uncomplicated: Secondary | ICD-10-CM | POA: Diagnosis not present

## 2021-12-17 DIAGNOSIS — J069 Acute upper respiratory infection, unspecified: Secondary | ICD-10-CM | POA: Diagnosis not present

## 2021-12-23 DIAGNOSIS — F1490 Cocaine use, unspecified, uncomplicated: Secondary | ICD-10-CM | POA: Diagnosis not present

## 2021-12-23 DIAGNOSIS — F1290 Cannabis use, unspecified, uncomplicated: Secondary | ICD-10-CM | POA: Diagnosis not present

## 2021-12-23 DIAGNOSIS — F419 Anxiety disorder, unspecified: Secondary | ICD-10-CM | POA: Diagnosis not present

## 2021-12-23 DIAGNOSIS — F1120 Opioid dependence, uncomplicated: Secondary | ICD-10-CM | POA: Diagnosis not present

## 2021-12-23 DIAGNOSIS — F17200 Nicotine dependence, unspecified, uncomplicated: Secondary | ICD-10-CM | POA: Diagnosis not present

## 2021-12-23 DIAGNOSIS — G4700 Insomnia, unspecified: Secondary | ICD-10-CM | POA: Diagnosis not present

## 2022-02-14 DIAGNOSIS — F1290 Cannabis use, unspecified, uncomplicated: Secondary | ICD-10-CM | POA: Diagnosis not present

## 2022-02-14 DIAGNOSIS — F419 Anxiety disorder, unspecified: Secondary | ICD-10-CM | POA: Diagnosis not present

## 2022-02-14 DIAGNOSIS — F1120 Opioid dependence, uncomplicated: Secondary | ICD-10-CM | POA: Diagnosis not present

## 2022-02-14 DIAGNOSIS — F1490 Cocaine use, unspecified, uncomplicated: Secondary | ICD-10-CM | POA: Diagnosis not present

## 2022-02-17 DIAGNOSIS — F1120 Opioid dependence, uncomplicated: Secondary | ICD-10-CM | POA: Diagnosis not present

## 2022-02-23 DIAGNOSIS — F1120 Opioid dependence, uncomplicated: Secondary | ICD-10-CM | POA: Diagnosis not present

## 2022-02-23 DIAGNOSIS — F1490 Cocaine use, unspecified, uncomplicated: Secondary | ICD-10-CM | POA: Diagnosis not present

## 2022-02-23 DIAGNOSIS — F419 Anxiety disorder, unspecified: Secondary | ICD-10-CM | POA: Diagnosis not present

## 2022-02-23 DIAGNOSIS — F1290 Cannabis use, unspecified, uncomplicated: Secondary | ICD-10-CM | POA: Diagnosis not present

## 2022-02-23 DIAGNOSIS — F17200 Nicotine dependence, unspecified, uncomplicated: Secondary | ICD-10-CM | POA: Diagnosis not present

## 2022-03-03 DIAGNOSIS — Z634 Disappearance and death of family member: Secondary | ICD-10-CM | POA: Diagnosis not present

## 2022-03-03 DIAGNOSIS — F1290 Cannabis use, unspecified, uncomplicated: Secondary | ICD-10-CM | POA: Diagnosis not present

## 2022-03-03 DIAGNOSIS — F1120 Opioid dependence, uncomplicated: Secondary | ICD-10-CM | POA: Diagnosis not present

## 2022-03-03 DIAGNOSIS — G4700 Insomnia, unspecified: Secondary | ICD-10-CM | POA: Diagnosis not present

## 2022-03-10 DIAGNOSIS — F1120 Opioid dependence, uncomplicated: Secondary | ICD-10-CM | POA: Diagnosis not present

## 2022-03-10 DIAGNOSIS — F1290 Cannabis use, unspecified, uncomplicated: Secondary | ICD-10-CM | POA: Diagnosis not present

## 2022-03-10 DIAGNOSIS — G4700 Insomnia, unspecified: Secondary | ICD-10-CM | POA: Diagnosis not present

## 2022-03-10 DIAGNOSIS — F17200 Nicotine dependence, unspecified, uncomplicated: Secondary | ICD-10-CM | POA: Diagnosis not present

## 2022-03-16 DIAGNOSIS — F1120 Opioid dependence, uncomplicated: Secondary | ICD-10-CM | POA: Diagnosis not present

## 2022-03-17 DIAGNOSIS — F419 Anxiety disorder, unspecified: Secondary | ICD-10-CM | POA: Diagnosis not present

## 2022-03-17 DIAGNOSIS — G4700 Insomnia, unspecified: Secondary | ICD-10-CM | POA: Diagnosis not present

## 2022-03-17 DIAGNOSIS — F1290 Cannabis use, unspecified, uncomplicated: Secondary | ICD-10-CM | POA: Diagnosis not present

## 2022-03-17 DIAGNOSIS — F1120 Opioid dependence, uncomplicated: Secondary | ICD-10-CM | POA: Diagnosis not present

## 2022-03-28 DIAGNOSIS — G4700 Insomnia, unspecified: Secondary | ICD-10-CM | POA: Diagnosis not present

## 2022-03-28 DIAGNOSIS — F419 Anxiety disorder, unspecified: Secondary | ICD-10-CM | POA: Diagnosis not present

## 2022-03-28 DIAGNOSIS — F1290 Cannabis use, unspecified, uncomplicated: Secondary | ICD-10-CM | POA: Diagnosis not present

## 2022-03-28 DIAGNOSIS — F1120 Opioid dependence, uncomplicated: Secondary | ICD-10-CM | POA: Diagnosis not present

## 2022-04-11 DIAGNOSIS — F1290 Cannabis use, unspecified, uncomplicated: Secondary | ICD-10-CM | POA: Diagnosis not present

## 2022-04-11 DIAGNOSIS — F1120 Opioid dependence, uncomplicated: Secondary | ICD-10-CM | POA: Diagnosis not present

## 2022-04-11 DIAGNOSIS — G4700 Insomnia, unspecified: Secondary | ICD-10-CM | POA: Diagnosis not present

## 2022-04-11 DIAGNOSIS — F419 Anxiety disorder, unspecified: Secondary | ICD-10-CM | POA: Diagnosis not present

## 2022-04-25 DIAGNOSIS — F1290 Cannabis use, unspecified, uncomplicated: Secondary | ICD-10-CM | POA: Diagnosis not present

## 2022-04-25 DIAGNOSIS — F17200 Nicotine dependence, unspecified, uncomplicated: Secondary | ICD-10-CM | POA: Diagnosis not present

## 2022-04-25 DIAGNOSIS — G4700 Insomnia, unspecified: Secondary | ICD-10-CM | POA: Diagnosis not present

## 2022-04-25 DIAGNOSIS — F1490 Cocaine use, unspecified, uncomplicated: Secondary | ICD-10-CM | POA: Diagnosis not present

## 2022-04-25 DIAGNOSIS — F1120 Opioid dependence, uncomplicated: Secondary | ICD-10-CM | POA: Diagnosis not present

## 2022-04-25 DIAGNOSIS — F419 Anxiety disorder, unspecified: Secondary | ICD-10-CM | POA: Diagnosis not present

## 2022-05-09 DIAGNOSIS — F1120 Opioid dependence, uncomplicated: Secondary | ICD-10-CM | POA: Diagnosis not present

## 2022-05-19 DIAGNOSIS — F1120 Opioid dependence, uncomplicated: Secondary | ICD-10-CM | POA: Diagnosis not present

## 2022-06-01 DIAGNOSIS — F1120 Opioid dependence, uncomplicated: Secondary | ICD-10-CM | POA: Diagnosis not present

## 2022-06-01 DIAGNOSIS — F1490 Cocaine use, unspecified, uncomplicated: Secondary | ICD-10-CM | POA: Diagnosis not present

## 2022-06-01 DIAGNOSIS — G4700 Insomnia, unspecified: Secondary | ICD-10-CM | POA: Diagnosis not present

## 2022-06-01 DIAGNOSIS — F419 Anxiety disorder, unspecified: Secondary | ICD-10-CM | POA: Diagnosis not present

## 2022-06-01 DIAGNOSIS — F1290 Cannabis use, unspecified, uncomplicated: Secondary | ICD-10-CM | POA: Diagnosis not present

## 2022-06-13 DIAGNOSIS — G4700 Insomnia, unspecified: Secondary | ICD-10-CM | POA: Diagnosis not present

## 2022-06-13 DIAGNOSIS — F1120 Opioid dependence, uncomplicated: Secondary | ICD-10-CM | POA: Diagnosis not present

## 2022-06-13 DIAGNOSIS — F1290 Cannabis use, unspecified, uncomplicated: Secondary | ICD-10-CM | POA: Diagnosis not present

## 2022-06-13 DIAGNOSIS — F419 Anxiety disorder, unspecified: Secondary | ICD-10-CM | POA: Diagnosis not present

## 2022-06-23 DIAGNOSIS — Z634 Disappearance and death of family member: Secondary | ICD-10-CM | POA: Diagnosis not present

## 2022-06-23 DIAGNOSIS — F1290 Cannabis use, unspecified, uncomplicated: Secondary | ICD-10-CM | POA: Diagnosis not present

## 2022-06-23 DIAGNOSIS — G4700 Insomnia, unspecified: Secondary | ICD-10-CM | POA: Diagnosis not present

## 2022-06-23 DIAGNOSIS — F1120 Opioid dependence, uncomplicated: Secondary | ICD-10-CM | POA: Diagnosis not present

## 2022-06-27 DIAGNOSIS — F1120 Opioid dependence, uncomplicated: Secondary | ICD-10-CM | POA: Diagnosis not present

## 2022-06-29 DIAGNOSIS — F1120 Opioid dependence, uncomplicated: Secondary | ICD-10-CM | POA: Diagnosis not present

## 2022-06-29 DIAGNOSIS — F419 Anxiety disorder, unspecified: Secondary | ICD-10-CM | POA: Diagnosis not present

## 2022-06-29 DIAGNOSIS — G4700 Insomnia, unspecified: Secondary | ICD-10-CM | POA: Diagnosis not present

## 2022-06-29 DIAGNOSIS — F1290 Cannabis use, unspecified, uncomplicated: Secondary | ICD-10-CM | POA: Diagnosis not present

## 2022-07-11 DIAGNOSIS — F1120 Opioid dependence, uncomplicated: Secondary | ICD-10-CM | POA: Diagnosis not present

## 2022-07-13 DIAGNOSIS — F1290 Cannabis use, unspecified, uncomplicated: Secondary | ICD-10-CM | POA: Diagnosis not present

## 2022-07-13 DIAGNOSIS — F419 Anxiety disorder, unspecified: Secondary | ICD-10-CM | POA: Diagnosis not present

## 2022-07-13 DIAGNOSIS — G4700 Insomnia, unspecified: Secondary | ICD-10-CM | POA: Diagnosis not present

## 2022-07-13 DIAGNOSIS — F1120 Opioid dependence, uncomplicated: Secondary | ICD-10-CM | POA: Diagnosis not present

## 2022-07-14 DIAGNOSIS — F1120 Opioid dependence, uncomplicated: Secondary | ICD-10-CM | POA: Diagnosis not present

## 2022-07-14 DIAGNOSIS — F1290 Cannabis use, unspecified, uncomplicated: Secondary | ICD-10-CM | POA: Diagnosis not present

## 2022-07-14 DIAGNOSIS — F419 Anxiety disorder, unspecified: Secondary | ICD-10-CM | POA: Diagnosis not present

## 2022-07-14 DIAGNOSIS — G4700 Insomnia, unspecified: Secondary | ICD-10-CM | POA: Diagnosis not present

## 2022-07-27 DIAGNOSIS — F1290 Cannabis use, unspecified, uncomplicated: Secondary | ICD-10-CM | POA: Diagnosis not present

## 2022-07-27 DIAGNOSIS — G4700 Insomnia, unspecified: Secondary | ICD-10-CM | POA: Diagnosis not present

## 2022-07-27 DIAGNOSIS — Z634 Disappearance and death of family member: Secondary | ICD-10-CM | POA: Diagnosis not present

## 2022-07-27 DIAGNOSIS — F1120 Opioid dependence, uncomplicated: Secondary | ICD-10-CM | POA: Diagnosis not present

## 2022-07-28 DIAGNOSIS — F1120 Opioid dependence, uncomplicated: Secondary | ICD-10-CM | POA: Diagnosis not present

## 2022-08-03 DIAGNOSIS — F1120 Opioid dependence, uncomplicated: Secondary | ICD-10-CM | POA: Diagnosis not present

## 2022-08-03 DIAGNOSIS — G4700 Insomnia, unspecified: Secondary | ICD-10-CM | POA: Diagnosis not present

## 2022-08-03 DIAGNOSIS — F1290 Cannabis use, unspecified, uncomplicated: Secondary | ICD-10-CM | POA: Diagnosis not present

## 2022-08-03 DIAGNOSIS — F419 Anxiety disorder, unspecified: Secondary | ICD-10-CM | POA: Diagnosis not present

## 2022-08-10 DIAGNOSIS — F1120 Opioid dependence, uncomplicated: Secondary | ICD-10-CM | POA: Diagnosis not present

## 2022-08-10 DIAGNOSIS — F419 Anxiety disorder, unspecified: Secondary | ICD-10-CM | POA: Diagnosis not present

## 2022-08-10 DIAGNOSIS — F1290 Cannabis use, unspecified, uncomplicated: Secondary | ICD-10-CM | POA: Diagnosis not present

## 2022-08-10 DIAGNOSIS — F17200 Nicotine dependence, unspecified, uncomplicated: Secondary | ICD-10-CM | POA: Diagnosis not present

## 2022-08-10 DIAGNOSIS — G4700 Insomnia, unspecified: Secondary | ICD-10-CM | POA: Diagnosis not present

## 2022-08-18 DIAGNOSIS — F419 Anxiety disorder, unspecified: Secondary | ICD-10-CM | POA: Diagnosis not present

## 2022-08-18 DIAGNOSIS — F1290 Cannabis use, unspecified, uncomplicated: Secondary | ICD-10-CM | POA: Diagnosis not present

## 2022-08-18 DIAGNOSIS — F1120 Opioid dependence, uncomplicated: Secondary | ICD-10-CM | POA: Diagnosis not present

## 2022-08-18 DIAGNOSIS — G4700 Insomnia, unspecified: Secondary | ICD-10-CM | POA: Diagnosis not present

## 2022-08-25 DIAGNOSIS — G4700 Insomnia, unspecified: Secondary | ICD-10-CM | POA: Diagnosis not present

## 2022-08-25 DIAGNOSIS — F1120 Opioid dependence, uncomplicated: Secondary | ICD-10-CM | POA: Diagnosis not present

## 2022-08-25 DIAGNOSIS — F1290 Cannabis use, unspecified, uncomplicated: Secondary | ICD-10-CM | POA: Diagnosis not present

## 2022-08-25 DIAGNOSIS — F419 Anxiety disorder, unspecified: Secondary | ICD-10-CM | POA: Diagnosis not present

## 2022-09-08 DIAGNOSIS — F1120 Opioid dependence, uncomplicated: Secondary | ICD-10-CM | POA: Diagnosis not present

## 2022-09-08 DIAGNOSIS — F1290 Cannabis use, unspecified, uncomplicated: Secondary | ICD-10-CM | POA: Diagnosis not present

## 2022-09-08 DIAGNOSIS — G4700 Insomnia, unspecified: Secondary | ICD-10-CM | POA: Diagnosis not present

## 2022-09-08 DIAGNOSIS — F17200 Nicotine dependence, unspecified, uncomplicated: Secondary | ICD-10-CM | POA: Diagnosis not present

## 2022-09-15 DIAGNOSIS — F1290 Cannabis use, unspecified, uncomplicated: Secondary | ICD-10-CM | POA: Diagnosis not present

## 2022-09-15 DIAGNOSIS — F419 Anxiety disorder, unspecified: Secondary | ICD-10-CM | POA: Diagnosis not present

## 2022-09-15 DIAGNOSIS — F17200 Nicotine dependence, unspecified, uncomplicated: Secondary | ICD-10-CM | POA: Diagnosis not present

## 2022-09-15 DIAGNOSIS — F1120 Opioid dependence, uncomplicated: Secondary | ICD-10-CM | POA: Diagnosis not present

## 2022-09-19 DIAGNOSIS — F1120 Opioid dependence, uncomplicated: Secondary | ICD-10-CM | POA: Diagnosis not present

## 2022-09-19 DIAGNOSIS — F419 Anxiety disorder, unspecified: Secondary | ICD-10-CM | POA: Diagnosis not present

## 2022-09-19 DIAGNOSIS — F1290 Cannabis use, unspecified, uncomplicated: Secondary | ICD-10-CM | POA: Diagnosis not present

## 2022-09-19 DIAGNOSIS — F1490 Cocaine use, unspecified, uncomplicated: Secondary | ICD-10-CM | POA: Diagnosis not present

## 2022-09-22 DIAGNOSIS — F1290 Cannabis use, unspecified, uncomplicated: Secondary | ICD-10-CM | POA: Diagnosis not present

## 2022-09-22 DIAGNOSIS — F419 Anxiety disorder, unspecified: Secondary | ICD-10-CM | POA: Diagnosis not present

## 2022-09-22 DIAGNOSIS — F1490 Cocaine use, unspecified, uncomplicated: Secondary | ICD-10-CM | POA: Diagnosis not present

## 2022-09-22 DIAGNOSIS — F17200 Nicotine dependence, unspecified, uncomplicated: Secondary | ICD-10-CM | POA: Diagnosis not present

## 2022-09-22 DIAGNOSIS — F1120 Opioid dependence, uncomplicated: Secondary | ICD-10-CM | POA: Diagnosis not present

## 2022-09-22 DIAGNOSIS — G4700 Insomnia, unspecified: Secondary | ICD-10-CM | POA: Diagnosis not present

## 2022-09-28 DIAGNOSIS — F1290 Cannabis use, unspecified, uncomplicated: Secondary | ICD-10-CM | POA: Diagnosis not present

## 2022-09-28 DIAGNOSIS — G4700 Insomnia, unspecified: Secondary | ICD-10-CM | POA: Diagnosis not present

## 2022-09-28 DIAGNOSIS — F17200 Nicotine dependence, unspecified, uncomplicated: Secondary | ICD-10-CM | POA: Diagnosis not present

## 2022-09-28 DIAGNOSIS — F1120 Opioid dependence, uncomplicated: Secondary | ICD-10-CM | POA: Diagnosis not present

## 2022-09-28 DIAGNOSIS — F419 Anxiety disorder, unspecified: Secondary | ICD-10-CM | POA: Diagnosis not present

## 2022-10-07 DIAGNOSIS — G4700 Insomnia, unspecified: Secondary | ICD-10-CM | POA: Diagnosis not present

## 2022-10-07 DIAGNOSIS — F1120 Opioid dependence, uncomplicated: Secondary | ICD-10-CM | POA: Diagnosis not present

## 2022-10-07 DIAGNOSIS — F419 Anxiety disorder, unspecified: Secondary | ICD-10-CM | POA: Diagnosis not present

## 2022-10-07 DIAGNOSIS — F1290 Cannabis use, unspecified, uncomplicated: Secondary | ICD-10-CM | POA: Diagnosis not present

## 2022-10-11 DIAGNOSIS — F1490 Cocaine use, unspecified, uncomplicated: Secondary | ICD-10-CM | POA: Diagnosis not present

## 2022-10-11 DIAGNOSIS — F1120 Opioid dependence, uncomplicated: Secondary | ICD-10-CM | POA: Diagnosis not present

## 2022-10-11 DIAGNOSIS — F1290 Cannabis use, unspecified, uncomplicated: Secondary | ICD-10-CM | POA: Diagnosis not present

## 2022-10-11 DIAGNOSIS — F419 Anxiety disorder, unspecified: Secondary | ICD-10-CM | POA: Diagnosis not present

## 2022-10-13 DIAGNOSIS — F1290 Cannabis use, unspecified, uncomplicated: Secondary | ICD-10-CM | POA: Diagnosis not present

## 2022-10-13 DIAGNOSIS — F1120 Opioid dependence, uncomplicated: Secondary | ICD-10-CM | POA: Diagnosis not present

## 2022-10-13 DIAGNOSIS — F419 Anxiety disorder, unspecified: Secondary | ICD-10-CM | POA: Diagnosis not present

## 2022-10-13 DIAGNOSIS — G4700 Insomnia, unspecified: Secondary | ICD-10-CM | POA: Diagnosis not present

## 2022-10-20 DIAGNOSIS — F1120 Opioid dependence, uncomplicated: Secondary | ICD-10-CM | POA: Diagnosis not present

## 2022-10-20 DIAGNOSIS — F419 Anxiety disorder, unspecified: Secondary | ICD-10-CM | POA: Diagnosis not present

## 2022-10-20 DIAGNOSIS — G4700 Insomnia, unspecified: Secondary | ICD-10-CM | POA: Diagnosis not present

## 2022-10-20 DIAGNOSIS — F1290 Cannabis use, unspecified, uncomplicated: Secondary | ICD-10-CM | POA: Diagnosis not present

## 2022-11-01 DIAGNOSIS — F419 Anxiety disorder, unspecified: Secondary | ICD-10-CM | POA: Diagnosis not present

## 2022-11-01 DIAGNOSIS — F1290 Cannabis use, unspecified, uncomplicated: Secondary | ICD-10-CM | POA: Diagnosis not present

## 2022-11-01 DIAGNOSIS — F1490 Cocaine use, unspecified, uncomplicated: Secondary | ICD-10-CM | POA: Diagnosis not present

## 2022-11-01 DIAGNOSIS — F1120 Opioid dependence, uncomplicated: Secondary | ICD-10-CM | POA: Diagnosis not present

## 2022-11-03 DIAGNOSIS — G4700 Insomnia, unspecified: Secondary | ICD-10-CM | POA: Diagnosis not present

## 2022-11-03 DIAGNOSIS — F419 Anxiety disorder, unspecified: Secondary | ICD-10-CM | POA: Diagnosis not present

## 2022-11-03 DIAGNOSIS — F1290 Cannabis use, unspecified, uncomplicated: Secondary | ICD-10-CM | POA: Diagnosis not present

## 2022-11-03 DIAGNOSIS — F1120 Opioid dependence, uncomplicated: Secondary | ICD-10-CM | POA: Diagnosis not present

## 2022-11-03 DIAGNOSIS — J069 Acute upper respiratory infection, unspecified: Secondary | ICD-10-CM | POA: Diagnosis not present

## 2022-11-03 DIAGNOSIS — F1490 Cocaine use, unspecified, uncomplicated: Secondary | ICD-10-CM | POA: Diagnosis not present

## 2022-11-10 ENCOUNTER — Encounter: Payer: Self-pay | Admitting: Cardiovascular Disease

## 2022-11-15 DIAGNOSIS — F1490 Cocaine use, unspecified, uncomplicated: Secondary | ICD-10-CM | POA: Diagnosis not present

## 2022-11-15 DIAGNOSIS — F1120 Opioid dependence, uncomplicated: Secondary | ICD-10-CM | POA: Diagnosis not present

## 2022-11-15 DIAGNOSIS — F419 Anxiety disorder, unspecified: Secondary | ICD-10-CM | POA: Diagnosis not present

## 2022-11-15 DIAGNOSIS — F1290 Cannabis use, unspecified, uncomplicated: Secondary | ICD-10-CM | POA: Diagnosis not present

## 2022-11-24 DIAGNOSIS — M25473 Effusion, unspecified ankle: Secondary | ICD-10-CM | POA: Diagnosis not present

## 2022-11-24 DIAGNOSIS — F1290 Cannabis use, unspecified, uncomplicated: Secondary | ICD-10-CM | POA: Diagnosis not present

## 2022-11-24 DIAGNOSIS — F1120 Opioid dependence, uncomplicated: Secondary | ICD-10-CM | POA: Diagnosis not present

## 2022-11-24 DIAGNOSIS — G4700 Insomnia, unspecified: Secondary | ICD-10-CM | POA: Diagnosis not present

## 2022-12-08 DIAGNOSIS — J069 Acute upper respiratory infection, unspecified: Secondary | ICD-10-CM | POA: Diagnosis not present

## 2022-12-08 DIAGNOSIS — F1290 Cannabis use, unspecified, uncomplicated: Secondary | ICD-10-CM | POA: Diagnosis not present

## 2022-12-08 DIAGNOSIS — F17200 Nicotine dependence, unspecified, uncomplicated: Secondary | ICD-10-CM | POA: Diagnosis not present

## 2022-12-08 DIAGNOSIS — F1120 Opioid dependence, uncomplicated: Secondary | ICD-10-CM | POA: Diagnosis not present

## 2022-12-08 DIAGNOSIS — F419 Anxiety disorder, unspecified: Secondary | ICD-10-CM | POA: Diagnosis not present

## 2022-12-08 DIAGNOSIS — G4700 Insomnia, unspecified: Secondary | ICD-10-CM | POA: Diagnosis not present

## 2022-12-09 ENCOUNTER — Ambulatory Visit: Payer: 59 | Attending: Cardiovascular Disease | Admitting: Cardiovascular Disease

## 2022-12-09 ENCOUNTER — Encounter: Payer: Self-pay | Admitting: *Deleted

## 2022-12-09 ENCOUNTER — Encounter: Payer: Self-pay | Admitting: Cardiovascular Disease

## 2022-12-09 VITALS — BP 118/70 | HR 81 | Ht 72.0 in | Wt 348.0 lb

## 2022-12-09 DIAGNOSIS — I442 Atrioventricular block, complete: Secondary | ICD-10-CM | POA: Diagnosis not present

## 2022-12-09 LAB — CUP PACEART INCLINIC DEVICE CHECK
Battery Remaining Longevity: 27 mo
Battery Voltage: 2.92 V
Brady Statistic AP VP Percent: 0.78 %
Brady Statistic AP VS Percent: 0 %
Brady Statistic AS VP Percent: 99.18 %
Brady Statistic AS VS Percent: 0.03 %
Brady Statistic RA Percent Paced: 0.78 %
Brady Statistic RV Percent Paced: 99.97 %
Date Time Interrogation Session: 20241206171227
Implantable Pulse Generator Implant Date: 20130226
Lead Channel Impedance Value: 3306 Ohm
Lead Channel Impedance Value: 399 Ohm
Lead Channel Impedance Value: 532 Ohm
Lead Channel Impedance Value: 779 Ohm
Lead Channel Pacing Threshold Amplitude: 0.625 V
Lead Channel Pacing Threshold Amplitude: 2.25 V
Lead Channel Pacing Threshold Pulse Width: 0.4 ms
Lead Channel Pacing Threshold Pulse Width: 0.4 ms
Lead Channel Sensing Intrinsic Amplitude: 6.875 mV
Lead Channel Sensing Intrinsic Amplitude: 7.125 mV
Lead Channel Setting Pacing Amplitude: 1.5 V
Lead Channel Setting Pacing Amplitude: 5 V
Lead Channel Setting Pacing Pulse Width: 0.4 ms
Lead Channel Setting Sensing Sensitivity: 2.8 mV
Zone Setting Status: 755011
Zone Setting Status: 755011
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
## 2022-12-09 NOTE — Patient Instructions (Signed)
Medication Instructions:  Continue all current medications.  Labwork: none  Testing/Procedures: none  Follow-Up: 1 year   Any Other Special Instructions Will Be Listed Below (If Applicable).  If you need a refill on your cardiac medications before your next appointment, please call your pharmacy.  

## 2022-12-15 DIAGNOSIS — F17200 Nicotine dependence, unspecified, uncomplicated: Secondary | ICD-10-CM | POA: Diagnosis not present

## 2022-12-15 DIAGNOSIS — F1290 Cannabis use, unspecified, uncomplicated: Secondary | ICD-10-CM | POA: Diagnosis not present

## 2022-12-15 DIAGNOSIS — G4700 Insomnia, unspecified: Secondary | ICD-10-CM | POA: Diagnosis not present

## 2022-12-15 DIAGNOSIS — F419 Anxiety disorder, unspecified: Secondary | ICD-10-CM | POA: Diagnosis not present

## 2022-12-15 DIAGNOSIS — F1120 Opioid dependence, uncomplicated: Secondary | ICD-10-CM | POA: Diagnosis not present

## 2022-12-19 DIAGNOSIS — F1120 Opioid dependence, uncomplicated: Secondary | ICD-10-CM | POA: Diagnosis not present

## 2022-12-19 DIAGNOSIS — F1490 Cocaine use, unspecified, uncomplicated: Secondary | ICD-10-CM | POA: Diagnosis not present

## 2022-12-19 DIAGNOSIS — F1290 Cannabis use, unspecified, uncomplicated: Secondary | ICD-10-CM | POA: Diagnosis not present

## 2022-12-19 DIAGNOSIS — F419 Anxiety disorder, unspecified: Secondary | ICD-10-CM | POA: Diagnosis not present

## 2022-12-22 DIAGNOSIS — J069 Acute upper respiratory infection, unspecified: Secondary | ICD-10-CM | POA: Diagnosis not present

## 2022-12-22 DIAGNOSIS — F17200 Nicotine dependence, unspecified, uncomplicated: Secondary | ICD-10-CM | POA: Diagnosis not present

## 2022-12-22 DIAGNOSIS — G4700 Insomnia, unspecified: Secondary | ICD-10-CM | POA: Diagnosis not present

## 2022-12-22 DIAGNOSIS — F419 Anxiety disorder, unspecified: Secondary | ICD-10-CM | POA: Diagnosis not present

## 2022-12-22 DIAGNOSIS — F1120 Opioid dependence, uncomplicated: Secondary | ICD-10-CM | POA: Diagnosis not present

## 2022-12-22 DIAGNOSIS — F1290 Cannabis use, unspecified, uncomplicated: Secondary | ICD-10-CM | POA: Diagnosis not present

## 2022-12-26 DIAGNOSIS — J069 Acute upper respiratory infection, unspecified: Secondary | ICD-10-CM | POA: Diagnosis not present

## 2022-12-26 DIAGNOSIS — F419 Anxiety disorder, unspecified: Secondary | ICD-10-CM | POA: Diagnosis not present

## 2022-12-26 DIAGNOSIS — G4700 Insomnia, unspecified: Secondary | ICD-10-CM | POA: Diagnosis not present

## 2022-12-26 DIAGNOSIS — F1490 Cocaine use, unspecified, uncomplicated: Secondary | ICD-10-CM | POA: Diagnosis not present

## 2022-12-26 DIAGNOSIS — F1290 Cannabis use, unspecified, uncomplicated: Secondary | ICD-10-CM | POA: Diagnosis not present

## 2022-12-26 DIAGNOSIS — F1120 Opioid dependence, uncomplicated: Secondary | ICD-10-CM | POA: Diagnosis not present

## 2022-12-30 DIAGNOSIS — K2970 Gastritis, unspecified, without bleeding: Secondary | ICD-10-CM | POA: Diagnosis not present

## 2022-12-30 DIAGNOSIS — R509 Fever, unspecified: Secondary | ICD-10-CM | POA: Diagnosis not present

## 2023-01-02 DIAGNOSIS — F1290 Cannabis use, unspecified, uncomplicated: Secondary | ICD-10-CM | POA: Diagnosis not present

## 2023-01-02 DIAGNOSIS — F17200 Nicotine dependence, unspecified, uncomplicated: Secondary | ICD-10-CM | POA: Diagnosis not present

## 2023-01-02 DIAGNOSIS — F419 Anxiety disorder, unspecified: Secondary | ICD-10-CM | POA: Diagnosis not present

## 2023-01-02 DIAGNOSIS — F1120 Opioid dependence, uncomplicated: Secondary | ICD-10-CM | POA: Diagnosis not present

## 2023-01-02 DIAGNOSIS — G4700 Insomnia, unspecified: Secondary | ICD-10-CM | POA: Diagnosis not present

## 2023-01-11 DIAGNOSIS — G4700 Insomnia, unspecified: Secondary | ICD-10-CM | POA: Diagnosis not present

## 2023-01-11 DIAGNOSIS — F1290 Cannabis use, unspecified, uncomplicated: Secondary | ICD-10-CM | POA: Diagnosis not present

## 2023-01-11 DIAGNOSIS — F1120 Opioid dependence, uncomplicated: Secondary | ICD-10-CM | POA: Diagnosis not present

## 2023-01-11 DIAGNOSIS — F17200 Nicotine dependence, unspecified, uncomplicated: Secondary | ICD-10-CM | POA: Diagnosis not present

## 2023-01-11 DIAGNOSIS — J069 Acute upper respiratory infection, unspecified: Secondary | ICD-10-CM | POA: Diagnosis not present

## 2023-01-11 DIAGNOSIS — F419 Anxiety disorder, unspecified: Secondary | ICD-10-CM | POA: Diagnosis not present

## 2023-01-18 DIAGNOSIS — F1120 Opioid dependence, uncomplicated: Secondary | ICD-10-CM | POA: Diagnosis not present

## 2023-01-18 DIAGNOSIS — F419 Anxiety disorder, unspecified: Secondary | ICD-10-CM | POA: Diagnosis not present

## 2023-01-18 DIAGNOSIS — G4700 Insomnia, unspecified: Secondary | ICD-10-CM | POA: Diagnosis not present

## 2023-01-18 DIAGNOSIS — F1290 Cannabis use, unspecified, uncomplicated: Secondary | ICD-10-CM | POA: Diagnosis not present

## 2023-01-18 DIAGNOSIS — F17200 Nicotine dependence, unspecified, uncomplicated: Secondary | ICD-10-CM | POA: Diagnosis not present

## 2023-01-19 DIAGNOSIS — F1120 Opioid dependence, uncomplicated: Secondary | ICD-10-CM | POA: Diagnosis not present

## 2023-01-19 DIAGNOSIS — F419 Anxiety disorder, unspecified: Secondary | ICD-10-CM | POA: Diagnosis not present

## 2023-01-19 DIAGNOSIS — F1910 Other psychoactive substance abuse, uncomplicated: Secondary | ICD-10-CM | POA: Diagnosis not present

## 2023-01-19 DIAGNOSIS — J069 Acute upper respiratory infection, unspecified: Secondary | ICD-10-CM | POA: Diagnosis not present

## 2023-01-19 DIAGNOSIS — G4700 Insomnia, unspecified: Secondary | ICD-10-CM | POA: Diagnosis not present

## 2023-01-19 DIAGNOSIS — F1490 Cocaine use, unspecified, uncomplicated: Secondary | ICD-10-CM | POA: Diagnosis not present

## 2023-01-19 DIAGNOSIS — F1290 Cannabis use, unspecified, uncomplicated: Secondary | ICD-10-CM | POA: Diagnosis not present

## 2023-01-26 DIAGNOSIS — G4700 Insomnia, unspecified: Secondary | ICD-10-CM | POA: Diagnosis not present

## 2023-01-26 DIAGNOSIS — F419 Anxiety disorder, unspecified: Secondary | ICD-10-CM | POA: Diagnosis not present

## 2023-01-26 DIAGNOSIS — F1290 Cannabis use, unspecified, uncomplicated: Secondary | ICD-10-CM | POA: Diagnosis not present

## 2023-01-26 DIAGNOSIS — F17200 Nicotine dependence, unspecified, uncomplicated: Secondary | ICD-10-CM | POA: Diagnosis not present

## 2023-01-26 DIAGNOSIS — J069 Acute upper respiratory infection, unspecified: Secondary | ICD-10-CM | POA: Diagnosis not present

## 2023-01-26 DIAGNOSIS — F1120 Opioid dependence, uncomplicated: Secondary | ICD-10-CM | POA: Diagnosis not present

## 2023-01-27 ENCOUNTER — Telehealth: Payer: Self-pay

## 2023-01-27 NOTE — Telephone Encounter (Signed)
I saw patient in December in [place] with Dr. Nelly Laurence.  He was wanting a new remote monitor mailed to him to try and use.   In past, his cell signal was not strong enough to support and he thinks now he should be able to use.   Kem mailed him a remote monitor per out last conversations. We were waiting for him to call and officially finish the set up.  Can you guys follow up with him and see what's going on?  I don't see where he has connected back with [place] yet.   Thanks so much.

## 2023-01-27 NOTE — Telephone Encounter (Signed)
It looks like I might have forgotten to order the monitor. I ordered him one today. He should receive it in 7-10 business days.

## 2023-02-02 DIAGNOSIS — F1120 Opioid dependence, uncomplicated: Secondary | ICD-10-CM | POA: Diagnosis not present

## 2023-02-02 DIAGNOSIS — M25473 Effusion, unspecified ankle: Secondary | ICD-10-CM | POA: Diagnosis not present

## 2023-02-02 DIAGNOSIS — G4700 Insomnia, unspecified: Secondary | ICD-10-CM | POA: Diagnosis not present

## 2023-02-02 DIAGNOSIS — F1290 Cannabis use, unspecified, uncomplicated: Secondary | ICD-10-CM | POA: Diagnosis not present

## 2023-02-09 NOTE — Telephone Encounter (Signed)
 LMOVM for pt to give us  a call back. I need to know if he has received his new Relay monitor and get a transmission with it.

## 2023-03-08 NOTE — Telephone Encounter (Signed)
 Attempted to reach patient.  Mobile number is not working. LM on home number but not sure this is a correct number.  He does not have my chart.   Can we mail him a letter?

## 2023-03-10 NOTE — Telephone Encounter (Signed)
 Letter sent.
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
## 2023-12-19 ENCOUNTER — Ambulatory Visit: Payer: MEDICAID | Attending: Physician Assistant | Admitting: Physician Assistant

## 2024-04-29 ENCOUNTER — Ambulatory Visit: Payer: MEDICAID

## 2024-07-29 ENCOUNTER — Ambulatory Visit: Payer: MEDICAID

## 2024-10-28 ENCOUNTER — Ambulatory Visit: Payer: MEDICAID

## 2025-01-27 ENCOUNTER — Ambulatory Visit: Payer: MEDICAID

## 2025-04-28 ENCOUNTER — Ambulatory Visit: Payer: MEDICAID

## 2025-07-28 ENCOUNTER — Ambulatory Visit: Payer: MEDICAID

## 2025-10-27 ENCOUNTER — Ambulatory Visit: Payer: MEDICAID

## 2026-01-26 ENCOUNTER — Ambulatory Visit: Payer: MEDICAID
